# Patient Record
Sex: Female | Born: 1980 | Race: Black or African American | Hispanic: No | Marital: Single | Smoking: Current every day smoker
Health system: Southern US, Community
[De-identification: ages and names within clinical notes are randomized; demographics above are authoritative.]

## PROBLEM LIST (undated history)

## (undated) DIAGNOSIS — D571 Sickle-cell disease without crisis: Secondary | ICD-10-CM

## (undated) DIAGNOSIS — J45909 Unspecified asthma, uncomplicated: Secondary | ICD-10-CM

---

## 2017-09-21 ENCOUNTER — Other Ambulatory Visit: Payer: Self-pay

## 2017-09-21 ENCOUNTER — Emergency Department (HOSPITAL_COMMUNITY): Payer: Medicaid Other

## 2017-09-21 ENCOUNTER — Inpatient Hospital Stay (HOSPITAL_COMMUNITY)
Admission: EM | Admit: 2017-09-21 | Discharge: 2017-09-24 | DRG: 812 | Discharge status: Against Medical Advice | Payer: Medicaid Other | Attending: Internal Medicine | Admitting: Internal Medicine

## 2017-09-21 ENCOUNTER — Encounter (HOSPITAL_COMMUNITY): Payer: Self-pay | Admitting: Emergency Medicine

## 2017-09-21 DIAGNOSIS — Z86711 Personal history of pulmonary embolism: Secondary | ICD-10-CM

## 2017-09-21 DIAGNOSIS — Z7951 Long term (current) use of inhaled steroids: Secondary | ICD-10-CM | POA: Diagnosis not present

## 2017-09-21 DIAGNOSIS — R160 Hepatomegaly, not elsewhere classified: Secondary | ICD-10-CM | POA: Diagnosis present

## 2017-09-21 DIAGNOSIS — Z888 Allergy status to other drugs, medicaments and biological substances status: Secondary | ICD-10-CM | POA: Diagnosis not present

## 2017-09-21 DIAGNOSIS — D5700 Hb-SS disease with crisis, unspecified: Secondary | ICD-10-CM | POA: Diagnosis present

## 2017-09-21 DIAGNOSIS — Z79891 Long term (current) use of opiate analgesic: Secondary | ICD-10-CM | POA: Diagnosis not present

## 2017-09-21 DIAGNOSIS — Z79899 Other long term (current) drug therapy: Secondary | ICD-10-CM | POA: Diagnosis not present

## 2017-09-21 DIAGNOSIS — Z5321 Procedure and treatment not carried out due to patient leaving prior to being seen by health care provider: Secondary | ICD-10-CM | POA: Diagnosis not present

## 2017-09-21 DIAGNOSIS — J45909 Unspecified asthma, uncomplicated: Secondary | ICD-10-CM | POA: Diagnosis present

## 2017-09-21 DIAGNOSIS — R109 Unspecified abdominal pain: Secondary | ICD-10-CM

## 2017-09-21 DIAGNOSIS — F17210 Nicotine dependence, cigarettes, uncomplicated: Secondary | ICD-10-CM | POA: Diagnosis present

## 2017-09-21 DIAGNOSIS — Z720 Tobacco use: Secondary | ICD-10-CM

## 2017-09-21 HISTORY — DX: Sickle-cell disease without crisis: D57.1

## 2017-09-21 HISTORY — DX: Unspecified asthma, uncomplicated: J45.909

## 2017-09-21 LAB — URINALYSIS, ROUTINE W REFLEX MICROSCOPIC
Bilirubin Urine: NEGATIVE
GLUCOSE, UA: NEGATIVE mg/dL
HGB URINE DIPSTICK: NEGATIVE
Ketones, ur: NEGATIVE mg/dL
Leukocytes, UA: NEGATIVE
Nitrite: NEGATIVE
PROTEIN: NEGATIVE mg/dL
Specific Gravity, Urine: 1.011 (ref 1.005–1.030)
pH: 6 (ref 5.0–8.0)

## 2017-09-21 LAB — CBC WITH DIFFERENTIAL/PLATELET
ABS IMMATURE GRANULOCYTES: 0.1 10*3/uL (ref 0.0–0.1)
BASOS ABS: 0.1 10*3/uL (ref 0.0–0.1)
Basophils Relative: 1 %
Eosinophils Absolute: 0.3 10*3/uL (ref 0.0–0.7)
Eosinophils Relative: 2 %
HCT: 25.1 % — ABNORMAL LOW (ref 36.0–46.0)
HEMOGLOBIN: 8.7 g/dL — AB (ref 12.0–15.0)
IMMATURE GRANULOCYTES: 1 %
LYMPHS PCT: 19 %
Lymphs Abs: 2.8 10*3/uL (ref 0.7–4.0)
MCH: 33.5 pg (ref 26.0–34.0)
MCHC: 34.7 g/dL (ref 30.0–36.0)
MCV: 96.5 fL (ref 78.0–100.0)
Monocytes Absolute: 1.2 10*3/uL — ABNORMAL HIGH (ref 0.1–1.0)
Monocytes Relative: 8 %
Neutro Abs: 10.1 10*3/uL — ABNORMAL HIGH (ref 1.7–7.7)
Neutrophils Relative %: 69 %
Platelets: 471 10*3/uL — ABNORMAL HIGH (ref 150–400)
RBC: 2.6 MIL/uL — AB (ref 3.87–5.11)
RDW: 19.4 % — ABNORMAL HIGH (ref 11.5–15.5)
WBC: 14.6 10*3/uL — AB (ref 4.0–10.5)

## 2017-09-21 LAB — COMPREHENSIVE METABOLIC PANEL
ALBUMIN: 3.9 g/dL (ref 3.5–5.0)
ALT: 14 U/L (ref 14–54)
AST: 21 U/L (ref 15–41)
Alkaline Phosphatase: 137 U/L — ABNORMAL HIGH (ref 38–126)
Anion gap: 8 (ref 5–15)
BILIRUBIN TOTAL: 2.2 mg/dL — AB (ref 0.3–1.2)
BUN: 9 mg/dL (ref 6–20)
CO2: 22 mmol/L (ref 22–32)
Calcium: 9 mg/dL (ref 8.9–10.3)
Chloride: 106 mmol/L (ref 101–111)
Creatinine, Ser: 0.58 mg/dL (ref 0.44–1.00)
GFR calc Af Amer: >60 mL/min (ref >60)
GFR calc non Af Amer: >60 mL/min (ref >60)
Glucose, Bld: 102 mg/dL — ABNORMAL HIGH (ref 65–99)
POTASSIUM: 4 mmol/L (ref 3.5–5.1)
Sodium: 136 mmol/L (ref 135–145)
TOTAL PROTEIN: 6.5 g/dL (ref 6.5–8.1)

## 2017-09-21 LAB — RETICULOCYTES: RBC.: 2.6 MIL/uL — AB (ref 3.87–5.11)

## 2017-09-21 LAB — I-STAT BETA HCG BLOOD, ED (MC, WL, AP ONLY): I-stat hCG, quantitative: <5 m[IU]/mL (ref <5)

## 2017-09-21 MED ORDER — HYDROMORPHONE HCL 2 MG/ML IJ SOLN: 2.0000 mg | INTRAMUSCULAR | Status: DC

## 2017-09-21 MED ORDER — HYDROMORPHONE HCL 2 MG/ML IJ SOLN: 2.0000 mg | INTRAMUSCULAR | Status: AC

## 2017-09-21 MED ORDER — DIPHENHYDRAMINE HCL 25 MG PO CAPS
50.0000 mg | ORAL_CAPSULE | Freq: Once | ORAL | Status: AC
  Administered 2017-09-21: 50 mg via ORAL
  Filled 2017-09-21: qty 2

## 2017-09-21 MED ORDER — METHADONE HCL 10 MG PO TABS
5.0000 mg | ORAL_TABLET | Freq: Two times a day (BID) | ORAL | Status: DC
  Administered 2017-09-21 – 2017-09-24 (×6): 5 mg via ORAL
  Filled 2017-09-21 (×6): qty 1

## 2017-09-21 MED ORDER — ACETAMINOPHEN 650 MG RE SUPP: 650.0000 mg | Freq: Four times a day (QID) | RECTAL | Status: DC | PRN

## 2017-09-21 MED ORDER — DULOXETINE HCL 60 MG PO CPEP
60.0000 mg | ORAL_CAPSULE | Freq: Every day | ORAL | Status: DC
  Administered 2017-09-21 – 2017-09-24 (×4): 60 mg via ORAL
  Filled 2017-09-21 (×4): qty 1

## 2017-09-21 MED ORDER — SODIUM CHLORIDE 0.9 % IV BOLUS: 1000.0000 mL | Freq: Once | INTRAVENOUS | Status: DC

## 2017-09-21 MED ORDER — OXYCODONE HCL 5 MG PO TABS
10.0000 mg | ORAL_TABLET | Freq: Three times a day (TID) | ORAL | Status: DC | PRN
  Administered 2017-09-21 – 2017-09-24 (×5): 10 mg via ORAL
  Filled 2017-09-21 (×5): qty 2

## 2017-09-21 MED ORDER — SODIUM CHLORIDE 0.45 % IV SOLN
INTRAVENOUS | Status: DC
  Administered 2017-09-21: 22:00:00 via INTRAVENOUS
  Administered 2017-09-22: 150 mL via INTRAVENOUS
  Administered 2017-09-22: 04:00:00 via INTRAVENOUS
  Administered 2017-09-22: 150 mL via INTRAVENOUS
  Administered 2017-09-23 – 2017-09-24 (×4): via INTRAVENOUS

## 2017-09-21 MED ORDER — HYDROMORPHONE HCL 2 MG/ML IJ SOLN
2.0000 mg | INTRAMUSCULAR | Status: AC
  Administered 2017-09-21: 2 mg via INTRAVENOUS
  Filled 2017-09-21: qty 1

## 2017-09-21 MED ORDER — SENNOSIDES-DOCUSATE SODIUM 8.6-50 MG PO TABS
1.0000 | ORAL_TABLET | Freq: Two times a day (BID) | ORAL | Status: DC
  Administered 2017-09-22 – 2017-09-24 (×3): 1 via ORAL
  Filled 2017-09-21 (×4): qty 1

## 2017-09-21 MED ORDER — ENOXAPARIN SODIUM 40 MG/0.4ML ~~LOC~~ SOLN
40.0000 mg | Freq: Every day | SUBCUTANEOUS | Status: DC
  Administered 2017-09-22: 40 mg via SUBCUTANEOUS
  Filled 2017-09-21 (×2): qty 0.4

## 2017-09-21 MED ORDER — FOLIC ACID 1 MG PO TABS
1.0000 mg | ORAL_TABLET | Freq: Every day | ORAL | Status: DC
  Administered 2017-09-22 – 2017-09-24 (×3): 1 mg via ORAL
  Filled 2017-09-21 (×3): qty 1

## 2017-09-21 MED ORDER — PROMETHAZINE HCL 25 MG/ML IJ SOLN
25.0000 mg | Freq: Four times a day (QID) | INTRAMUSCULAR | Status: DC | PRN
  Administered 2017-09-22 – 2017-09-24 (×9): 25 mg via INTRAVENOUS
  Filled 2017-09-21 (×9): qty 1

## 2017-09-21 MED ORDER — SODIUM CHLORIDE 0.9% FLUSH
3.0000 mL | Freq: Two times a day (BID) | INTRAVENOUS | Status: DC
  Administered 2017-09-21 – 2017-09-24 (×2): 3 mL via INTRAVENOUS

## 2017-09-21 MED ORDER — HYDROMORPHONE HCL 2 MG/ML IJ SOLN
2.0000 mg | INTRAMUSCULAR | Status: DC | PRN
  Administered 2017-09-21 – 2017-09-22 (×3): 2 mg via INTRAVENOUS
  Filled 2017-09-21 (×3): qty 1

## 2017-09-21 MED ORDER — PROMETHAZINE HCL 25 MG PO TABS: 25.0000 mg | ORAL_TABLET | Freq: Four times a day (QID) | ORAL | Status: DC | PRN

## 2017-09-21 MED ORDER — KETOROLAC TROMETHAMINE 15 MG/ML IJ SOLN
15.0000 mg | Freq: Once | INTRAMUSCULAR | Status: DC
Start: 2017-09-21 — End: 2017-09-21
  Filled 2017-09-21: qty 1

## 2017-09-21 MED ORDER — OXYCODONE HCL 10 MG PO TABS: 10.0000 mg | ORAL_TABLET | Freq: Three times a day (TID) | ORAL | Status: DC | PRN

## 2017-09-21 MED ORDER — HYDROXYUREA 500 MG PO CAPS
1000.0000 mg | ORAL_CAPSULE | Freq: Every day | ORAL | Status: DC
  Filled 2017-09-21 (×4): qty 2

## 2017-09-21 MED ORDER — ALBUTEROL SULFATE (2.5 MG/3ML) 0.083% IN NEBU: 3.0000 mL | INHALATION_SOLUTION | Freq: Four times a day (QID) | RESPIRATORY_TRACT | Status: DC | PRN

## 2017-09-21 MED ORDER — GABAPENTIN 100 MG PO CAPS
100.0000 mg | ORAL_CAPSULE | Freq: Three times a day (TID) | ORAL | Status: DC
  Administered 2017-09-22 – 2017-09-24 (×9): 100 mg via ORAL
  Filled 2017-09-21 (×9): qty 1

## 2017-09-21 MED ORDER — DULOXETINE HCL 30 MG PO CPEP
30.0000 mg | ORAL_CAPSULE | Freq: Every day | ORAL | Status: DC
  Administered 2017-09-22 – 2017-09-24 (×3): 30 mg via ORAL
  Filled 2017-09-21 (×3): qty 1

## 2017-09-21 MED ORDER — HYDROMORPHONE HCL 2 MG/ML IJ SOLN
2.0000 mg | INTRAMUSCULAR | Status: DC
  Administered 2017-09-21: 2 mg via INTRAVENOUS
  Filled 2017-09-21 (×2): qty 1

## 2017-09-21 MED ORDER — MOMETASONE FURO-FORMOTEROL FUM 200-5 MCG/ACT IN AERO
2.0000 | INHALATION_SPRAY | Freq: Two times a day (BID) | RESPIRATORY_TRACT | Status: DC
  Administered 2017-09-22 – 2017-09-23 (×2): 2 via RESPIRATORY_TRACT
  Filled 2017-09-21 (×2): qty 8.8

## 2017-09-21 MED ORDER — DIPHENHYDRAMINE HCL 25 MG PO CAPS
50.0000 mg | ORAL_CAPSULE | Freq: Three times a day (TID) | ORAL | Status: DC | PRN
  Administered 2017-09-22 (×2): 50 mg via ORAL
  Filled 2017-09-21 (×2): qty 2

## 2017-09-21 MED ORDER — PROMETHAZINE HCL 25 MG/ML IJ SOLN
25.0000 mg | Freq: Once | INTRAMUSCULAR | Status: AC
  Administered 2017-09-21: 25 mg via INTRAMUSCULAR
  Filled 2017-09-21: qty 1

## 2017-09-21 MED ORDER — HYDROMORPHONE HCL 2 MG/ML IJ SOLN
2.0000 mg | INTRAMUSCULAR | Status: AC
  Administered 2017-09-21: 2 mg via INTRAMUSCULAR
  Filled 2017-09-21: qty 1

## 2017-09-21 MED ORDER — NICOTINE 14 MG/24HR TD PT24
14.0000 mg | MEDICATED_PATCH | Freq: Every day | TRANSDERMAL | Status: DC
  Filled 2017-09-21 (×2): qty 1

## 2017-09-21 MED ORDER — NALOXONE HCL 0.4 MG/ML IJ SOLN
0.4000 mg | INTRAMUSCULAR | Status: DC | PRN
Start: 2017-09-21 — End: 2017-09-24

## 2017-09-21 MED ORDER — ACETAMINOPHEN 325 MG PO TABS: 650.0000 mg | ORAL_TABLET | Freq: Four times a day (QID) | ORAL | Status: DC | PRN

## 2017-09-21 MED ORDER — POLYETHYLENE GLYCOL 3350 17 G PO PACK
17.0000 g | PACK | Freq: Every day | ORAL | Status: DC
  Administered 2017-09-22: 17 g via ORAL
  Filled 2017-09-21 (×2): qty 1

## 2017-09-21 MED ORDER — IOHEXOL 300 MG/ML  SOLN
100.0000 mL | Freq: Once | INTRAMUSCULAR | Status: AC | PRN
  Administered 2017-09-21: 100 mL via INTRAVENOUS

## 2017-09-21 NOTE — ED Notes (Signed)
Pt informed that IV team was consulted for second line placement

## 2017-09-21 NOTE — ED Triage Notes (Signed)
Pt. Stated, My legs, and back have been hurting since yesterday

## 2017-09-21 NOTE — ED Notes (Signed)
DC Ketorolac per attending resident

## 2017-09-21 NOTE — H&P (Signed)
Date: 09/21/2017               Patient Name:  Joanna [name] MRN: [id number]  DOB: 1980-05-05 Age / Sex: 37 y.o., female   PCP: Patient, No Pcp Per         Medical Service: Internal Medicine Teaching Service         Attending Physician: Dr. Mikey BussingHoffman, Marthenia RollingErik C, DO    First Contact: Dr. Evelene CroonSantos  Pager: [phone]  Second Contact: Dr. Noemi ChapelBethany Molt Pager: [phone]       After Hours (After 5p/  First Contact Pager: [phone]  weekends / holidays): Second Contact Pager: [phone]   Chief Complaint: Bilateral lower extremity pain, lower abdominal pain, nausea and vomiting   History of Present Illness:  Joanna [name] is a 37 yo female with history of hemoglobin SS disease, ?history of PE, and tobacco abuse who presents to the ED with a 2-day history of bilateral lower extremity pain, lower back pain, and abdominal pain. She was in her usual state of health until 2 days ago when she started to experience bilateral lower extremity pain across her entire legs that she describes as typical during her pain crises. She also complains of lower back pain and lower abdominal pain that is sharp in nature, intermittent, and associated with 2 episodes of NBNB emesis. She is on oxycodone and methadone at home which she has been compliant with until yesterday when the nausea vomiting began. Denies recent illness, changes in medications, or changes in diet. Denies HA, dizziness, fever, chills, chest pain, shortness of breath, and urinary symptoms.  She does report one episode of loose stools, but denies diarrhea.  She [facility] pain crisis and states she did require a blood transfusion at that time.  ED course: Patient was afebrile, hemodynamically stable and oxygenating well on room air. Blood work remarkable for WBC 14.6, Hgb 8.7 (at baseline), and reticulocyte count 2.6. CT abd/pelvis with 2 low-attenuating masses in R hepatic lobe, but otherwise unremarkable. Received Dilaudid  2 mg x4.   Meds:  Current Meds  Medication Sig  . albuterol (PROVENTIL HFA) 108 (90 Base) MCG/ACT inhaler Inhale 2 puffs into the lungs every 6 (six) hours as needed.  . DULoxetine (CYMBALTA) 30 MG capsule Take 90 mg by mouth See admin instructions. Take 2 capsules (60mg ) in th AM and 1 capsule (30mg ) in the evening.  . fluticasone (FLONASE) 50 MCG/ACT nasal spray Place 1 spray into the nose as needed.  . Fluticasone-Salmeterol (ADVAIR DISKUS) 250-50 MCG/DOSE AEPB Inhale 1 puff into the lungs 2 (two) times daily.  . folic acid (FOLVITE) 1 MG tablet Take 1 mg by mouth daily.  . furosemide (LASIX) 20 MG tablet Take 20 mg by mouth daily as needed. For leg swelling  . gabapentin (NEURONTIN) 100 MG capsule Take 100 mg by mouth 3 (three) times daily.  . hydroxyurea (HYDREA) 100 mg/mL SUSP Take 1,000 mg by mouth daily.  . methadone (DOLOPHINE) 5 MG tablet Take 5 mg by mouth every 12 (twelve) hours.  . naloxone (NARCAN) nasal spray 4 mg/0.1 mL Place 1 spray into the nose once.   . Oxycodone HCl 10 MG TABS Take 10 mg by mouth 3 (three) times daily as needed for pain.    Allergies: Allergies as of 09/21/2017 - Review Complete 09/21/2017  Allergen Reaction Noted  . Ondansetron Hives, Itching, and Rash 09/28/2005  . Metoclopramide Hives and Rash 09/16/2010  Past Medical History:  Diagnosis Date  . Asthma   . Sickle cell anemia (HCC)     Family History:  Family History  Problem Relation Age of Onset  . Diabetes Father     Social History: Patient reports alcohol intake socially with last drink about 2 months ago. Smokes 1 ppd. Denies illicit drug use.   Review of Systems: A complete ROS was negative except as per HPI.   Physical Exam: Blood pressure 105/68, pulse 66, temperature 98.6 F (37 C), temperature source Oral, resp. rate 17, height 4\' 11"  (1.499 m), weight 150 lb (68 kg), last menstrual period 09/07/2017, SpO2 95 %.  Physical Exam  Constitutional: She is oriented to person,  place, and time and well-developed, well-nourished, and in no distress.  HENT:  Head: Normocephalic and atraumatic.  Mouth/Throat: Oropharynx is clear and moist.  Eyes: Conjunctivae are normal.  Neck: Normal range of motion. Neck supple.  Cardiovascular: Normal rate, regular rhythm and normal heart sounds. Exam reveals no gallop and no friction rub.  No murmur heard. Pulmonary/Chest: Effort normal and breath sounds normal. No respiratory distress. She has no wheezes. She has no rales.  Abdominal: Soft. Bowel sounds are normal. She exhibits no distension. There is no tenderness. There is no guarding.  Musculoskeletal: She exhibits edema (Trace bilatera LE edema ) and tenderness (Tenderness to palpation over bilateral extremities and lower back ). She exhibits no deformity.  Neurological: She is alert and oriented to person, place, and time.  Skin: She is not diaphoretic.    EKG: none obtained   CT abdomen/pelvis w/ contrast: personally reviewed my interpretation is: No acute findings; 2 non-specific low-attenuation masses in right hepatic lobe felt to represent focal nodular hyperplasia or adenomas. Non-emergent abdomen MRI recommended for further characterization.   Assessment & Plan by Problem: Principal Problem:   Sickle cell pain crisis (HCC) Active Problems:   Abdominal pain   Tobacco abuse  # Sickle cell pain crisis: Patient presents with bilateral LE pain, lower back pain, lower abdominal pain and N/V that are typical during her pain crisis and not improving with home opiates. Unclear trigger, patient denies recent illness or stressors. Hemoglobin at baseline.  Follows up with hematology at [facility] and was admitted 3-4 months ago with similar symptoms requiring 2 blood transfusions.  - IV Dilaudid 2 mg q2h PRN. Plan to switch to PCA if poor pain control on current regimen  - Continue home oxy 10 TID PRN and methadone 5 mg BID PRN  - Continue home Cymbalta 90 mg QD and gabapentin 100 mg  TID  - LDH and haptoglobin   # Abdominal pain associated with nausea and vomiting: Sharp and intermittent in nature and located in bilateral lower quadrants. No urinary symptoms or vaginal discharge. CT abd/pelvis with 2 R hepatic lobe masses but otherwise unremarkable.  Abdominal exam benign without tenderness to palpation. Etiology uncertain. Could be associated with menstrual cycle, but patient unable to recall LMP. UA clean therefore low suspicion for  UTI.  We will continue to monitor for now given no symptoms.  Anti-emetics as above. Ordered regular diet, but can de-escalate if patient continues to have N/V.   # Tobacco abuse: Reports smoking 1 ppd for "years." - Nicotine patch 14 mg QD   F: 1L NS bolus followed by maintenance with 1/2NS E: monitoring, avoid hypernatremia  N: Regular diet   VTE ppx: SQ Lovenox   Code status: Full code, not confirmed on admission   Dispo: Admit patient  to Inpatient with expected length of stay greater than 2 midnights.  SignedBurna Cash, MD 09/21/2017, 7:20 PM  Pager: [phone]

## 2017-09-21 NOTE — ED Provider Notes (Signed)
[facility] Provider Note   CSN: [id number] Arrival date & time: 09/21/17  0754     History   Chief Complaint Chief Complaint  Patient presents with  . Sickle Cell Pain Crisis  . Leg Pain  . Back Pain    HPI Hillary Bowngel Golaszewski is a 37 y.o. female.  HPI  37 year old female with a history of sickle cell anemia, SS, presents with back pain, bilateral leg pain, and lower abdominal pain.  She states the back pain is relatively new feature in her lower back for the last couple months.  However its worse over the last 2 days.  It is across her entire lower back and seems to be worse when walking.  No weakness or numbness in her legs but her legs do swell whenever they hurt.  The leg pain is typical of her sickle cell crises, as well as some leg swelling.  The sharp, lower abdominal pain that comes and goes is new.  She vomited yesterday, including some of her pain medicines.  No diarrhea, constipation, fever, urinary symptoms or vaginal symptoms.  She is on methadone and oxycodone.  Past Medical History:  Diagnosis Date  . Asthma   . Sickle cell anemia [facility](HCC)     Patient Active Problem List   Diagnosis Date Noted  . Sickle cell pain crisis (HCC) 09/21/2017    History reviewed. No pertinent surgical history.   OB History   None      Home Medications    Prior to Admission medications   Medication Sig Start Date End Date Taking? Authorizing Provider  albuterol (PROVENTIL HFA) 108 (90 Base) MCG/ACT inhaler Inhale 2 puffs into the lungs every 6 (six) hours as needed. 12/20/13  Yes [provider]  DULoxetine (CYMBALTA) 30 MG capsule Take 90 mg by mouth See admin instructions. Take 2 capsules (60mg ) in th AM and 1 capsule (30mg ) in the evening. 09/04/17  Yes [provider]  fluticasone (FLONASE) 50 MCG/ACT nasal spray Place 1 spray into the nose as needed.   Yes [provider]  Fluticasone-Salmeterol (ADVAIR DISKUS) 250-50  MCG/DOSE AEPB Inhale 1 puff into the lungs 2 (two) times daily. 08/26/17  Yes [provider]  folic acid (FOLVITE) 1 MG tablet Take 1 mg by mouth daily.   Yes [provider]  furosemide (LASIX) 20 MG tablet Take 20 mg by mouth daily as needed. For leg swelling 08/26/17  Yes [provider]  gabapentin (NEURONTIN) 100 MG capsule Take 100 mg by mouth 3 (three) times daily. 08/17/17  Yes [provider]  hydroxyurea (HYDREA) 100 mg/mL SUSP Take 1,000 mg by mouth daily.   Yes [provider]  methadone (DOLOPHINE) 5 MG tablet Take 5 mg by mouth every 12 (twelve) hours.   Yes [provider]  naloxone (NARCAN) nasal spray 4 mg/0.1 mL Place 1 spray into the nose once.  11/21/14  Yes [provider]  Oxycodone HCl 10 MG TABS Take 10 mg by mouth 3 (three) times daily as needed for pain.   Yes [provider]    Family History Family History  Problem Relation Age of Onset  . Diabetes Father     Social History Social History   Tobacco Use  . Smoking status: Current Every Day Smoker    Packs/day: 1.00  . Smokeless tobacco: Current User  Substance Use Topics  . Alcohol use: Not Currently    Comment: "every blue moon"  . Drug  use: Not Currently     Allergies   Ondansetron and Metoclopramide   Review of Systems Review of Systems  Constitutional: Negative for fever.  Respiratory: Negative for shortness of breath.   Cardiovascular: Negative for chest pain.  Gastrointestinal: Positive for abdominal pain, nausea and vomiting. Negative for constipation and diarrhea.  Genitourinary: Negative for dysuria, hematuria, vaginal bleeding and vaginal discharge.  Musculoskeletal: Positive for back pain and myalgias.  Neurological: Negative for weakness and numbness.  All other systems reviewed and are negative.    Physical Exam Updated Vital Signs BP 105/68   Pulse 66   Temp 98.6 F (37 C) (Oral)   Resp 17   Ht 4\' 11"   (1.499 m)   Wt 68 kg (150 lb)   LMP 09/07/2017   SpO2 95%   BMI 30.30 kg/m   Physical Exam  Constitutional: She is oriented to person, place, and time. She appears well-developed and well-nourished. No distress.  Appears in pain  HENT:  Head: Normocephalic and atraumatic.  Right Ear: External ear normal.  Left Ear: External ear normal.  Nose: Nose normal.  Eyes: Right eye exhibits no discharge. Left eye exhibits no discharge.  Cardiovascular: Normal rate, regular rhythm and normal heart sounds.  Pulmonary/Chest: Effort normal and breath sounds normal.  Abdominal: Soft. She exhibits no distension. There is no tenderness.  Musculoskeletal:       Thoracic back: She exhibits no tenderness and no bony tenderness.       Lumbar back: She exhibits no tenderness and no bony tenderness.       Right lower leg: She exhibits swelling. She exhibits no tenderness, no bony tenderness and no edema.       Left lower leg: She exhibits swelling. She exhibits no tenderness, no bony tenderness and no edema.  No pitting edema to BLE. Normal gross sensation. Normal strength in BLE  Neurological: She is alert and oriented to person, place, and time.  Skin: Skin is warm and dry. She is not diaphoretic.  Nursing note and vitals reviewed.    ED Treatments / Results  Labs (all labs ordered are listed, but only abnormal results are displayed) Labs Reviewed  COMPREHENSIVE METABOLIC PANEL - Abnormal; Notable for the following components:      Result Value   Glucose, Bld 102 (*)    Alkaline Phosphatase 137 (*)    Total Bilirubin 2.2 (*)    All other components within normal limits  CBC WITH DIFFERENTIAL/PLATELET - Abnormal; Notable for the following components:   WBC 14.6 (*)    RBC 2.60 (*)    Hemoglobin 8.7 (*)    HCT 25.1 (*)    RDW 19.4 (*)    Platelets 471 (*)    Neutro Abs 10.1 (*)    Monocytes Absolute 1.2 (*)    All other components within normal limits  RETICULOCYTES - Abnormal; Notable  for the following components:   Retic Ct Pct >23.0 (*)    RBC. 2.60 (*)    All other components within normal limits  URINALYSIS, ROUTINE W REFLEX MICROSCOPIC  HIV ANTIBODY (ROUTINE TESTING)  CBC  BASIC METABOLIC PANEL  I-STAT BETA HCG BLOOD, ED ([facility], [facility], [facility] ONLY)    EKG None  Radiology Ct Abdomen Pelvis W Contrast  Result Date: 09/21/2017 CLINICAL DATA:  Lower abdominal and back pain for 2 days. Sickle cell disease. EXAM: CT ABDOMEN AND PELVIS WITH CONTRAST TECHNIQUE: Multidetector CT imaging of the abdomen and pelvis was performed using the standard protocol  following bolus administration of intravenous contrast. CONTRAST:  OMNIPAQUE IOHEXOL 300 MG/ML  SOLN COMPARISON:  None. FINDINGS: Lower Chest: No acute findings.  Bibasilar scarring. Hepatobiliary: 2.1 cm low-attenuation lesion is seen in the dome of the right hepatic lobe. A 2nd low-subtle attenuation lesion is seen in the inferior right hepatic lobe measuring 2.8 cm. These both have nonspecific characteristics. Prior cholecystectomy. No evidence of biliary obstruction. Pancreas:  No mass or inflammatory changes. 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 Adrenals/Urinary Tract: No masses identified. No evidence of hydronephrosis. Unremarkable unopacified urinary bladder. Stomach/Bowel: No evidence of obstruction, inflammatory process or abnormal fluid collections. Normal appendix visualized. Vascular/Lymphatic: No pathologically enlarged lymph nodes. No abdominal aortic aneurysm. Reproductive:  No mass or other significant abnormality. Other:  None. Musculoskeletal:  No suspicious bone lesions identified. IMPRESSION: No acute findings. At least 2 low-attenuation masses in right hepatic lobe which have nonspecific characteristics. These may represent focal nodular hyperplasia or adenomas given patient's age and sex. Abdomen MRI without and with contrast is recommended for further  characterization. Electronically Signed   By: Myles Rosenthal M.D.   On: 09/21/2017 12:12    Procedures Procedures (including critical care time)  Medications Ordered in ED Medications  sodium chloride 0.9 % bolus 1,000 mL ( Intravenous Continued from Pre-op 09/21/17 1408)  0.45 % sodium chloride infusion (has no administration in time range)  diphenhydrAMINE (BENADRYL) capsule 50 mg (has no administration in time range)  HYDROmorphone (DILAUDID) injection 2 mg (2 mg Intravenous Not Given 09/21/17 1408)  promethazine (PHENERGAN) injection 25 mg (has no administration in time range)  albuterol (PROVENTIL) (2.5 MG/3ML) 0.083% nebulizer solution 3 mL (has no administration in time range)  DULoxetine (CYMBALTA) DR capsule 90 mg (has no administration in time range)  mometasone-formoterol (DULERA) 200-5 MCG/ACT inhaler 2 puff (has no administration in time range)  folic acid (FOLVITE) tablet 1 mg (has no administration in time range)  gabapentin (NEURONTIN) capsule 100 mg (has no administration in time range)  hydroxyurea (HYDREA) 100 mg/mL oral suspension 1,000 mg (has no administration in time range)  methadone (DOLOPHINE) tablet 5 mg (has no administration in time range)  enoxaparin (LOVENOX) injection 40 mg (has no administration in time range)  sodium chloride flush (NS) 0.9 % injection 3 mL (has no administration in time range)  naloxone (NARCAN) injection 0.4 mg (has no administration in time range)  acetaminophen (TYLENOL) tablet 650 mg (has no administration in time range)    Or  acetaminophen (TYLENOL) suppository 650 mg (has no administration in time range)  oxyCODONE (Oxy IR/ROXICODONE) immediate release tablet 10 mg (has no administration in time range)  senna-docusate (Senokot-S) tablet 1 tablet (has no administration in time range)  polyethylene glycol (MIRALAX / GLYCOLAX) packet 17 g (has no administration in time range)  nicotine (NICODERM CQ - dosed in mg/24 hours) patch 14 mg  (has no administration in time range)  HYDROmorphone (DILAUDID) injection 2 mg (2 mg Intramuscular Given 09/21/17 0915)    Or  HYDROmorphone (DILAUDID) injection 2 mg ( Subcutaneous See Alternative 09/21/17 0915)  HYDROmorphone (DILAUDID) injection 2 mg (2 mg Intravenous Given 09/21/17 1032)    Or  HYDROmorphone (DILAUDID) injection 2 mg ( Subcutaneous See Alternative 09/21/17 1032)  promethazine (PHENERGAN) injection 25 mg (25 mg Intramuscular Given 09/21/17 0914)  iohexol (OMNIPAQUE) 300 MG/ML solution 100 mL (100 mLs Intravenous Contrast Given 09/21/17 1116)  diphenhydrAMINE (BENADRYL) capsule 50 mg (50 mg Oral Given 09/21/17 1154)  Initial Impression / Assessment and Plan / ED Course  I have reviewed the triage vital signs and the nursing notes.  Pertinent labs & imaging results that were available during my care of the patient were reviewed by me and considered in my medical decision making (see chart for details).     The patient's sickle cell pain crisis is not well controlled with IV Dilaudid.  I will add on Toradol given negative CT.  Hemoglobin seems to be a lot similar to her typical.  While the abdominal pain and back pain are new for her, there does not seem to be a new emergent process.  Given poor pain control, she will be admitted to the internalizing teaching service.  Final Clinical Impressions(s) / ED Diagnoses   Final diagnoses:  Sickle cell pain crisis [facility])    ED Discharge Orders    None       Pricilla Loveless, MD 09/21/17 1718

## 2017-09-21 NOTE — ED Notes (Signed)
Gave pt [place] sandwich, applesauce and ice water, per Morrie SheldonAshley - RN.

## 2017-09-21 NOTE — ED Notes (Signed)
IV established per IVT.  Pt informed that RN to bring pain medications.  Pt requests Benadryl and phenergan and dilaudid at the same time.  This RN informed pt that she will receive the hydromorphone then will be re-evaluated for the phenergan and benadryl by the oncoming RN

## 2017-09-21 NOTE — ED Notes (Addendum)
Regular diet dinner tray ordered 

## 2017-09-22 ENCOUNTER — Encounter (HOSPITAL_COMMUNITY): Payer: Self-pay | Admitting: *Deleted

## 2017-09-22 ENCOUNTER — Other Ambulatory Visit: Payer: Self-pay

## 2017-09-22 LAB — BASIC METABOLIC PANEL
ANION GAP: 7 (ref 5–15)
BUN: 9 mg/dL (ref 6–20)
CALCIUM: 8.8 mg/dL — AB (ref 8.9–10.3)
CO2: 24 mmol/L (ref 22–32)
CREATININE: 0.58 mg/dL (ref 0.44–1.00)
Chloride: 105 mmol/L (ref 101–111)
GFR calc non Af Amer: >60 mL/min (ref >60)
Glucose, Bld: 96 mg/dL (ref 65–99)
Potassium: 4.5 mmol/L (ref 3.5–5.1)
Sodium: 136 mmol/L (ref 135–145)

## 2017-09-22 LAB — CBC
HEMATOCRIT: 21.9 % — AB (ref 36.0–46.0)
HEMOGLOBIN: 7.5 g/dL — AB (ref 12.0–15.0)
MCH: 32.5 pg (ref 26.0–34.0)
MCHC: 34.2 g/dL (ref 30.0–36.0)
MCV: 94.8 fL (ref 78.0–100.0)
Platelets: 464 10*3/uL — ABNORMAL HIGH (ref 150–400)
RBC: 2.31 MIL/uL — ABNORMAL LOW (ref 3.87–5.11)
RDW: 19.9 % — AB (ref 11.5–15.5)
WBC: 11.9 10*3/uL — AB (ref 4.0–10.5)

## 2017-09-22 LAB — LACTATE DEHYDROGENASE: LDH: 228 U/L — AB (ref 98–192)

## 2017-09-22 MED ORDER — HYDROMORPHONE HCL 1 MG/ML IJ SOLN
2.0000 mg | INTRAMUSCULAR | Status: DC | PRN
  Administered 2017-09-22 (×2): 2 mg via INTRAVENOUS
  Filled 2017-09-22 (×2): qty 2

## 2017-09-22 MED ORDER — HYDROMORPHONE HCL 1 MG/ML IJ SOLN
2.0000 mg | INTRAMUSCULAR | Status: DC | PRN
  Administered 2017-09-22 – 2017-09-24 (×23): 2 mg via INTRAVENOUS
  Filled 2017-09-22 (×23): qty 2

## 2017-09-22 NOTE — Progress Notes (Signed)
Pt request dilaudid 2mg  IV,phenergan 25mg  IV and benadryl 50mg  all at one time.Pt complained that the phenergan 25mg  IV did not burn her vein when I gave it IV.Explained to pt that IV phenergan is diluted and giving by slow IV push to prevent severe tissue damage,Pt yelled and stated" that its suppose to burn my vein BITCH !!" I explained to pt that if phenergan is giving fast through the IV it will burn her veins,pt refused teaching she will need reinforcement on how phenergan IV is giving will continue to monitor

## 2017-09-22 NOTE — Evaluation (Signed)
Physical Therapy Evaluation Patient Details Name: Joanna [name] MRN: [id number] DOB: [birth year] Today's Date: 09/22/2017   History of Present Illness  Joanna [name] is a 37 yo female with history of hemoglobin SS disease, ?history of PE, and tobacco abuse who presents to the ED with a 2-day history of bilateral lower extremity pain, lower back pain, and abdominal pain.  Clinical Impression  Patient presents at modified independent to independent level for mobility.  Does exhibit some LE weakness and has lower back pain that pre-dates this crisis.  Feel she may benefit from outpatient PT in her area.  Educated her to research if [employer] has a Programmer, applications so she could participate even if her Medicaid does not cover it.  No further skilled acute PT needs at this time.     Follow Up Recommendations Outpatient PT    Equipment Recommendations  None recommended by PT    Recommendations for Other Services       Precautions / Restrictions Precautions Precautions: None      Mobility  Bed Mobility Overal bed mobility: Modified Independent                Transfers Overall transfer level: Modified independent                  Ambulation/Gait Ambulation/Gait assistance: Independent Ambulation Distance (Feet): 150 Feet Assistive device: None Gait Pattern/deviations: Step-through pattern;Decreased stride length     General Gait Details: good pace   Stairs Stairs: Yes Stairs assistance: Modified independent (Device/Increase time) Stair Management: One rail Right Number of Stairs: 3 General stair comments: good pace and no assist, but c/o pain in legs with stairs  Wheelchair Mobility    Modified Rankin (Stroke Patients Only)       Balance Overall balance assessment: Needs assistance;No apparent balance deficits (not formally assessed)                                           Pertinent Vitals/Pain Pain Assessment: 0-10 Pain  Score: 6  Pain Location: legs Pain Descriptors / Indicators: Aching Pain Intervention(s): Monitored during session;Repositioned    Home Living Family/patient expects to be discharged to:: Private residence Living Arrangements: Parent Available Help at Discharge: Family;Available PRN/intermittently Type of Home: House Home Access: Stairs to enter Entrance Stairs-Rails: None Entrance Stairs-Number of Steps: 3 Home Layout: One level Home Equipment: None      Prior Function Level of Independence: Independent         Comments: has some difficulty getting out of tub at times; also has back pain over past 2-3 months that limits walking to about 15 minutes     Hand Dominance        Extremity/Trunk Assessment   Upper Extremity Assessment Upper Extremity Assessment: Overall WFL for tasks assessed    Lower Extremity Assessment Lower Extremity Assessment: RLE deficits/detail;LLE deficits/detail RLE Deficits / Details: AROM WFL, strength grossly 3+ to 4-  LLE Deficits / Details: AROM WFL, strength grossly 3+ to 4-        Communication   Communication: No difficulties  Cognition Arousal/Alertness: Awake/alert Behavior During Therapy: WFL for tasks assessed/performed Overall Cognitive Status: Within Functional Limits for tasks assessed  General Comments General comments (skin integrity, edema, etc.): Patient reports back pain that is across her lower back below waist line from one side to the other; not tender to palpation and not exacerbated by flexion/extension, but aggravated from walking >15 minutes.  Reports has not done therapy for her back, concerned about payment for it due to Medicaid.  Educated about possibility of financial assistance at facility near [place] near where she lives.     Exercises     Assessment/Plan    PT Assessment All further PT needs can be met in the next venue of care  PT Problem List          PT Treatment Interventions      PT Goals (Current goals can be found in the Care Plan section)  Acute Rehab PT Goals PT Goal Formulation: All assessment and education complete, DC therapy    Frequency     Barriers to discharge        Co-evaluation               AM-PAC PT "6 Clicks" Daily Activity  Outcome Measure Difficulty turning over in bed (including adjusting bedclothes, sheets and blankets)?: None Difficulty moving from lying on back to sitting on the side of the bed? : None Difficulty sitting down on and standing up from a chair with arms (e.g., wheelchair, bedside commode, etc,.)?: None Help needed moving to and from a bed to chair (including a wheelchair)?: None Help needed walking in hospital room?: None Help needed climbing 3-5 steps with a railing? : A Little 6 Click Score: 23    End of Session   Activity Tolerance: Patient tolerated treatment well Patient left: in bed;with call bell/phone within reach   PT Visit Diagnosis: Pain;Muscle weakness (generalized) (M62.81) Pain - Right/Left: (both sides) Pain - part of body: (lower back)    Time: 8768-1157 PT Time Calculation (min) (ACUTE ONLY): 20 min   Charges:   PT Evaluation $PT Eval Low Complexity: 1 Low     PT G CodesMagda Kiel, [place] [phone] 09/22/2017   Reginia Naas 09/22/2017, 2:22 PM

## 2017-09-22 NOTE — Progress Notes (Signed)
   Subjective:  No acute events overnight.  She continues to report bilateral lower extremity pain that is improved from yesterday.  Lower back pain also improved.  Continues to report poor appetite but states will try to eat today. No further nausea or vomiting.  Denies diarrhea.  Objective:  Vital signs in last 24 hours: Vitals:   09/22/17 0132 09/22/17 0140 09/22/17 0523 09/22/17 0944  BP:  (!) 95/56 97/63 (!) 96/57  Pulse:  71 84 76  Resp:  16 16 18   Temp:  98.1 F (36.7 C) 98.2 F (36.8 C) 98.4 F (36.9 C)  TempSrc:  Oral Oral Oral  SpO2:  92% 90% 96%  Weight: 154 lb 12.2 oz (70.2 kg)     Height:       Physical Exam  Constitutional: She is well-developed, well-nourished, and in no distress.  Cardiovascular: Normal rate, regular rhythm and normal heart sounds. Exam reveals no gallop and no friction rub.  No murmur heard. Pulmonary/Chest: Effort normal and breath sounds normal. No respiratory distress. She has no wheezes. She has no rales.  Abdominal: Soft. Bowel sounds are normal. She exhibits no distension. There is no tenderness.  Musculoskeletal: She exhibits edema (Trace bilateral lower extremity edema, mildly improved from yesterday) and tenderness (Tenderness over bilateral lower extremities).  Neurological: She is alert.    Assessment/Plan:  Principal Problem:   Sickle cell pain crisis (HCC) Active Problems:   Abdominal pain   Tobacco abuse  # Sickle cell pain crisis: Continues to report bilateral lower extremity pain that is improved from yesterday.  Blood work unremarkable this morning. Hemoglobin remains at baseline.  LDH 288 and haptoglobin process. Will continue pain management as below as patient reports improvement in pain.  - IV Dilaudid 2 mg q2h PRN. Plan to switch to PCA if poor pain control on current regimen  - Continue home oxy 10 TID PRN and methadone 5 mg BID PRN  - Continue home Cymbalta 90 mg QD and gabapentin 100 mg TID   # Abdominal pain  associated with nausea and vomiting: Abdominal pain is resolved as well as nausea vomiting, though she continues to report poor appetite.  She is likely secondary to to acute sickle cell crisis and would expect this to improve with conservative management as below. - mIVF with 0.5NS @ 125 cc/hr  - IV Phenergan 25 mg q6h PRN  - Regular diet     Dispo: Anticipated discharge in approximately 2-3 day(s) pending improvement in pain.   Burna CashSantos-Sanchez, Jahshua Bonito, MD 09/22/2017, 11:49 AM Pager: [phone]

## 2017-09-23 ENCOUNTER — Inpatient Hospital Stay (HOSPITAL_COMMUNITY): Payer: Medicaid Other

## 2017-09-23 LAB — CBC
HEMATOCRIT: 18.7 % — AB (ref 36.0–46.0)
HEMOGLOBIN: 6.7 g/dL — AB (ref 12.0–15.0)
MCH: 33.3 pg (ref 26.0–34.0)
MCHC: 35.8 g/dL (ref 30.0–36.0)
MCV: 93 fL (ref 78.0–100.0)
Platelets: 470 10*3/uL — ABNORMAL HIGH (ref 150–400)
RBC: 2.01 MIL/uL — ABNORMAL LOW (ref 3.87–5.11)
RDW: 20.6 % — AB (ref 11.5–15.5)
WBC: 14.8 10*3/uL — AB (ref 4.0–10.5)

## 2017-09-23 LAB — HAPTOGLOBIN

## 2017-09-23 LAB — HIV ANTIBODY (ROUTINE TESTING W REFLEX): HIV Screen 4th Generation wRfx: NONREACTIVE

## 2017-09-23 NOTE — Progress Notes (Addendum)
Patient refused miralax and senokot; discussed risk for constipation with opiate use.  Patient refused lovenox, discussed risk of blood clot.   Patient stated MDs are allowing her to take PRN Dilaudid and Oxycodone at the same time. Informed patient we will alternate medication if pain is persistent but they will not be administered together.

## 2017-09-23 NOTE — Progress Notes (Signed)
Patients room smelled like cigarette smoke, patient denies smoking but has been made aware that it is against hospital policy

## 2017-09-23 NOTE — Progress Notes (Signed)
   Subjective:  No acute events overnight. This morning patient was resting in bed comfortably.  She did complain of bilateral hip pain that has been improving with IV pain medicine. She is otherwise feeling well. States she believes she might feel back to baseline tomorrow.   Objective:  Vital signs in last 24 hours: Vitals:   09/22/17 0944 09/22/17 1738 09/22/17 2113 09/23/17 0536  BP: (!) 96/57 101/65 107/67 96/63  Pulse: 76 78 85 75  Resp: 18 18 16 18   Temp: 98.4 F (36.9 C) 98.6 F (37 C) 99.3 F (37.4 C) 98.9 F (37.2 C)  TempSrc: Oral Oral Oral Oral  SpO2: 96% 98% 93% (!) 89%  Weight:   155 lb (70.3 kg)   Height:       Physical Exam  Constitutional: She is oriented to person, place, and time and well-developed, well-nourished, and in no distress.  Cardiovascular: Normal rate, regular rhythm and normal heart sounds. Exam reveals no gallop and no friction rub.  No murmur heard. Pulmonary/Chest: Effort normal and breath sounds normal. No respiratory distress. She has no wheezes. She has no rales.  Abdominal: Soft. Bowel sounds are normal. She exhibits no distension. There is no tenderness. There is no rebound.  Musculoskeletal: She exhibits tenderness (bilateral LE TTP but improved from yesterday). She exhibits no edema.  Neurological: She is alert and oriented to person, place, and time.    Assessment/Plan:  Principal Problem:   Sickle cell pain crisis (HCC) Active Problems:   Abdominal pain   Tobacco abuse  # Sickle cell pain crisis: Complaining of bilateral hip pain that improves with IV pain medicine. Hemoglobin 6.7 today but asymptomatic and vital signs remain stable.  LDH 288 and haptoglobin < 10 (unclear baseline).  No indication for blood transfusion at this time.  We will continue to monitor blood counts. - CBC in AM  - IV Dilaudid 2 mg q2h PRN  - Continue home oxy 10 TID PRN and methadone 5 mg BID PRN  # Abdominal pain associated with nausea and vomiting:  Pain resolved and appetite improving.  - mIVF with 0.5NS @ 100 cc/hr  - IV Phenergan 25 mg q6h PRN  - Regular diet   - Will need follow up MRI to evaluate hepatic masses seen on admission CT abd/pelvis given second mass (not on prior MRI). Can consider doing during this admission vs outpatient.    Dispo: Anticipated discharge in approximately 1-2 day(s).   Joanna CashSantos-Sanchez, Joanna Stinger, MD 09/23/2017, 7:49 AM Pager: [phone]

## 2017-09-24 MED ORDER — SODIUM CHLORIDE 0.45 % IV BOLUS
500.0000 mL | Freq: Once | INTRAVENOUS | Status: AC
  Administered 2017-09-24: 500 mL via INTRAVENOUS

## 2017-09-24 NOTE — Progress Notes (Signed)
Patient is again refusing MRI due to the fact she would need to be NPO for four hours

## 2017-09-24 NOTE — Progress Notes (Signed)
Patient refused CBC, MD aware, patient is aware of risks

## 2017-09-24 NOTE — Progress Notes (Signed)
Patient left AMA. IV removed.

## 2017-09-24 NOTE — Progress Notes (Signed)
Made MD aware that patient refused MRI.  Also, patient c/o right arm feeling like it is "asleep".  No other neuro deficits noted.  No additional orders received.  Will continue to monitor.  Joanna CoveyKimberly Susumu Hackler RN-BC, [employer]

## 2017-09-24 NOTE — Progress Notes (Addendum)
   Subjective:  No acute events overnight. Patient is feeling much better this morning and reports no complaints.   Objective:  Vital signs in last 24 hours: Vitals:   09/23/17 0917 09/23/17 1701 09/23/17 2040 09/24/17 0550  BP: 100/69 105/61 97/66 103/67  Pulse: 81 78 87 82  Resp: 18 18 20 20   Temp: 98.8 F (37.1 C) 98.6 F (37 C) 98.2 F (36.8 C) 97.8 F (36.6 C)  TempSrc: Oral Oral Oral Oral  SpO2: 94% 96% 92% 96%  Weight:      Height:       Physical Exam  Constitutional: She is well-developed, well-nourished, and in no distress.  Cardiovascular: Normal rate, regular rhythm and normal heart sounds. Exam reveals no gallop.  No murmur heard. Pulmonary/Chest: Effort normal and breath sounds normal. No respiratory distress. She has no wheezes. She has no rales.  Musculoskeletal: She exhibits no edema or tenderness.  Neurological: She is alert.    Assessment/Plan:  Principal Problem:   Sickle cell pain crisis (HCC) Active Problems:   Abdominal pain   Tobacco abuse  # Sickle cell pain crisis:States she feels much better this morning and believes she will back to baseline tomorrow. Will continue IV fluids and analgesics as below.  - Follow up CBC  - IV Dilaudid 2 mg q2h PRN  - Continue home oxy 10 TID PRN and methadone 5 mg BID PRN  # Abdominal pain associated with nausea and vomiting:Resolved. Tolerating PO intake.  - mIVF with 0.5NS @ 100 cc/hr  - IV Phenergan 25 mg q6h PRN  - Regular diet  # Liver masses:  -  Noted to have a 2.8x3.4 cm R hepatic lobe mass with nonspecific characteristics on MRI abdomen in 10/2016. Per report, does not have the appearance of a simple cyst or hemangioma. CT abdomen/pelvis on admission with 2 noted R hepatic lobe masses (1 know and 1 new) also described as nonspecific characteristics. Recommended outpatient MRI abdomen for further evaluation as patient declined this during this admission.  - MRI abdomen as an outpatient     Dispo: Anticipated discharge in approximately 1 day.    Burna CashSantos-Sanchez, Hardie Veltre, MD 09/24/2017, 6:27 AM Pager: [phone]

## 2017-09-24 NOTE — Progress Notes (Signed)
Patient  Had soft BP and was waiting for bolus to finish to receive pain meds when she decided she wanted to go home. She stated she did not want to wait for MD. AMA papers signed. MD made aware.

## 2017-09-25 NOTE — Discharge Summary (Signed)
Name: Joanna [name] MRN: [id number] DOB: 02-27-1981 37 y.o. PCP: Patient, No Pcp Per  Date of Admission: 09/21/2017  7:57 AM Date of Discharge: 09/24/2017 Attending Physician: Joanna [name]   Discharge Diagnosis: 1. Sickle cell pain crisis  2. Liver masses   Discharge Medications: Allergies as of 09/24/2017      Reactions   Ondansetron Hives, Itching, Rash   Other reaction(s): Itching   Metoclopramide Hives, Rash   Other reaction(s): Itching Other reaction(s): Hives      Medication List    ASK your doctor about these medications   ADVAIR DISKUS 250-50 MCG/DOSE Aepb Generic drug:  Fluticasone-Salmeterol Inhale 1 puff into the lungs 2 (two) times daily.   DULoxetine 30 MG capsule Commonly known as:  CYMBALTA Take 90 mg by mouth See admin instructions. Take 2 capsules (85m) in th AM and 1 capsule (369m in the evening.   fluticasone 50 MCG/ACT nasal spray Commonly known as:  FLONASE Place 1 spray into the nose as needed.   folic acid 1 MG tablet Commonly known as:  FOLVITE Take 1 mg by mouth daily.   furosemide 20 MG tablet Commonly known as:  LASIX Take 20 mg by mouth daily as needed. For leg swelling   gabapentin 100 MG capsule Commonly known as:  NEURONTIN Take 100 mg by mouth 3 (three) times daily.   hydroxyurea 100 mg/mL Susp Commonly known as:  HYDREA Take 1,000 mg by mouth daily.   methadone 5 MG tablet Commonly known as:  DOLOPHINE Take 5 mg by mouth every 12 (twelve) hours.   NARCAN 4 MG/0.1ML Liqd nasal spray kit Generic drug:  naloxone Place 1 spray into the nose once.   Oxycodone HCl 10 MG Tabs Take 10 mg by mouth 3 (three) times daily as needed for pain.   PROVENTIL HFA 108 (90 Base) MCG/ACT inhaler Generic drug:  albuterol Inhale 2 puffs into the lungs every 6 (six) hours as needed.       Disposition and follow-up:   [name]  At the hospital follow up visit  please address:  1.  Please assess for ongoing lower extremity and lower abdomen pain. Please obtain a CBC to check hemoglobin as last one obtained was 6.7 (lower than baseline of 7-9) and patient declined further work up.   2.  Labs / imaging needed at time of follow-up: CBC to check Hgb   3.  Pending labs/ test needing follow-up: MRI abdomen to evaluate 2 R hepatic lobe masses with non-specific characteristics    Follow-up Appointments: None set up as patient left [facility] problem list:  1. Sickle cell pain crisis: Patient presented with bilateral lower extremity pain, abdominal pain and N/V consistent with previous sickle cell pain crises. She was continued on her home oxycodone and methadone and was provided with IV pain medication for breakthrough pain as well as IV fluids. Her hemoglobin was at baseline (7-9) on admission and was noted to trend down on day prior to discharge, Hgb 6.7. She was however hemodynamically stable and asymptomatic. Patient declined further blood work and left AMA on 6/13.  2. Liver masses: Patient has a history of a right hepatic lobe mass that was noted on MRI abdomen on 10/2016.  On CT abdomen/pelvis obtained on admission she was noted to have two right hepatic lobe masses with non-specific characteristics (see report below). MRI abdomen ordered for further evaluation, but  patient declined. She was recommended to get an MRI abdomen as an outpatient.   Discharge Vitals:   BP (!) 91/58   Pulse 79   Temp 98.6 F (37 C) (Oral)   Resp 13   Ht 4' 11" (1.499 m)   Wt 155 lb (70.3 kg)   LMP 09/07/2017   SpO2 96%   BMI 31.31 kg/m   Pertinent Labs, Studies, and Procedures:  CBC Latest Ref Rng & Units 09/23/2017 09/22/2017 09/21/2017  WBC 4.0 - 10.5 K/uL 14.8(H) 11.9(H) 14.6(H)  Hemoglobin 12.0 - 15.0 g/dL 6.7(LL) 7.5(L) 8.7(L)  Hematocrit 36.0 - 46.0 % 18.7(L) 21.9(L) 25.1(L)  Platelets 150 - 400 K/uL 470(H) 464(H) 471(H)    BMP Latest Ref Rng  & Units 09/22/2017 09/21/2017  Glucose 65 - 99 mg/dL 96 102(H)  BUN 6 - 20 mg/dL 9 9  Creatinine 0.44 - 1.00 mg/dL 0.58 0.58  Sodium 135 - 145 mmol/L 136 136  Potassium 3.5 - 5.1 mmol/L 4.5 4.0  Chloride 101 - 111 mmol/L 105 106  CO2 22 - 32 mmol/L 24 22  Calcium 8.9 - 10.3 mg/dL 8.8(L) 9.0    CT abdomen/pelvis 09/21/2017: IMPRESSION: No acute findings. At least 2 low-attenuation masses in right hepatic lobe which have nonspecific characteristics. These may represent focal nodular hyperplasia or adenomas given patient's age and sex. Abdomen MRI without and with contrast is recommended for further characterization.   Discharge Instructions: No discharge instructions provided as patient left AMA.    SignedWelford Roche, [name] 09/25/2017, 7:46 AM   Pager: [phone]

## 2017-09-27 ENCOUNTER — Emergency Department (HOSPITAL_COMMUNITY)
Admission: EM | Admit: 2017-09-27 | Discharge: 2017-09-27 | Disposition: A | Discharge status: Home / Self Care | Payer: Medicaid Other | Source: Home / Self Care | Attending: Emergency Medicine | Admitting: Emergency Medicine

## 2017-09-27 DIAGNOSIS — Z79899 Other long term (current) drug therapy: Secondary | ICD-10-CM

## 2017-09-27 DIAGNOSIS — D5700 Hb-SS disease with crisis, unspecified: Secondary | ICD-10-CM | POA: Insufficient documentation

## 2017-09-27 DIAGNOSIS — F17210 Nicotine dependence, cigarettes, uncomplicated: Secondary | ICD-10-CM

## 2017-09-27 DIAGNOSIS — J45909 Unspecified asthma, uncomplicated: Secondary | ICD-10-CM

## 2017-09-27 LAB — COMPREHENSIVE METABOLIC PANEL
ALBUMIN: 4 g/dL (ref 3.5–5.0)
ALK PHOS: 126 U/L (ref 38–126)
ALT: 16 U/L (ref 14–54)
AST: 21 U/L (ref 15–41)
Anion gap: 8 (ref 5–15)
BUN: <5 mg/dL — ABNORMAL LOW (ref 6–20)
CO2: 21 mmol/L — AB (ref 22–32)
CREATININE: 0.59 mg/dL (ref 0.44–1.00)
Calcium: 9 mg/dL (ref 8.9–10.3)
Chloride: 106 mmol/L (ref 101–111)
GFR calc non Af Amer: >60 mL/min (ref >60)
GLUCOSE: 102 mg/dL — AB (ref 65–99)
Potassium: 3.3 mmol/L — ABNORMAL LOW (ref 3.5–5.1)
SODIUM: 135 mmol/L (ref 135–145)
Total Bilirubin: 1.7 mg/dL — ABNORMAL HIGH (ref 0.3–1.2)
Total Protein: 7 g/dL (ref 6.5–8.1)

## 2017-09-27 LAB — CBC WITH DIFFERENTIAL/PLATELET
BASOS PCT: 0 %
Basophils Absolute: 0 10*3/uL (ref 0.0–0.1)
Eosinophils Absolute: 0.4 10*3/uL (ref 0.0–0.7)
Eosinophils Relative: 2 %
HCT: 25.4 % — ABNORMAL LOW (ref 36.0–46.0)
HEMOGLOBIN: 8.4 g/dL — AB (ref 12.0–15.0)
Lymphocytes Relative: 28 %
Lymphs Abs: 5.2 10*3/uL — ABNORMAL HIGH (ref 0.7–4.0)
MCH: 33.1 pg (ref 26.0–34.0)
MCHC: 33.1 g/dL (ref 30.0–36.0)
MCV: 100 fL (ref 78.0–100.0)
MONO ABS: 1.1 10*3/uL — AB (ref 0.1–1.0)
Monocytes Relative: 6 %
NEUTROS ABS: 12 10*3/uL — AB (ref 1.7–7.7)
NEUTROS PCT: 64 %
PLATELETS: 584 10*3/uL — AB (ref 150–400)
RBC: 2.54 MIL/uL — ABNORMAL LOW (ref 3.87–5.11)
RDW: 22.6 % — ABNORMAL HIGH (ref 11.5–15.5)
WBC: 18.7 10*3/uL — ABNORMAL HIGH (ref 4.0–10.5)

## 2017-09-27 LAB — I-STAT BETA HCG BLOOD, ED (MC, WL, AP ONLY)

## 2017-09-27 LAB — RETICULOCYTES
RBC.: 2.54 MIL/uL — ABNORMAL LOW (ref 3.87–5.11)
Retic Ct Pct: >23 % — ABNORMAL HIGH (ref 0.4–3.1)

## 2017-09-27 MED ORDER — DIPHENHYDRAMINE HCL 25 MG PO CAPS
25.0000 mg | ORAL_CAPSULE | Freq: Once | ORAL | Status: AC
  Administered 2017-09-27: 25 mg via ORAL
  Filled 2017-09-27: qty 1

## 2017-09-27 MED ORDER — HYDROMORPHONE HCL 2 MG/ML IJ SOLN
2.0000 mg | Freq: Once | INTRAMUSCULAR | Status: AC
  Administered 2017-09-27: 2 mg via INTRAVENOUS
  Filled 2017-09-27: qty 1

## 2017-09-27 MED ORDER — POTASSIUM CHLORIDE CRYS ER 20 MEQ PO TBCR
40.0000 meq | EXTENDED_RELEASE_TABLET | Freq: Once | ORAL | Status: AC
  Administered 2017-09-27: 40 meq via ORAL
  Filled 2017-09-27: qty 2

## 2017-09-27 MED ORDER — HYDROMORPHONE HCL 2 MG/ML IJ SOLN
2.0000 mg | Freq: Once | INTRAMUSCULAR | Status: AC
  Administered 2017-09-27: 2 mg via INTRAMUSCULAR
  Filled 2017-09-27: qty 1

## 2017-09-27 MED ORDER — PROMETHAZINE HCL 25 MG PO TABS
25.0000 mg | ORAL_TABLET | Freq: Once | ORAL | Status: AC
  Administered 2017-09-27: 25 mg via ORAL
  Filled 2017-09-27: qty 1

## 2017-09-27 MED ORDER — KETOROLAC TROMETHAMINE 30 MG/ML IJ SOLN
30.0000 mg | Freq: Once | INTRAMUSCULAR | Status: AC
  Administered 2017-09-27: 30 mg via INTRAMUSCULAR
  Filled 2017-09-27: qty 1

## 2017-09-27 NOTE — ED Provider Notes (Signed)
[facility] EMERGENCY DEPARTMENT Provider Note   CSN: [id number] Arrival date & time: 09/27/17  0139     History   Chief Complaint Chief Complaint  Patient presents with  . Sickle Cell Pain Crisis    HPI Joanna [name] is a 37 y.o. female.  HPI  Patient is a 37 year old female with a history of asthma and sickle cell anemia, followed by [facility] hematology and oncology, presenting for bilateral arm pain, low back pain, and leg pain.  Patient reports that her symptoms are consistent with her prior sickle cell pain crises.  Patient reports that she was recently admitted for sickle cell pain crisis and discharged 3 days ago, and she did have relief of her symptoms, however she began having severe extremity and low back pain last night.  Patient reports that she was too nauseous to take her home pain regimen.  Patient denies any missed doses of hydroxyurea.  Patient denies any fevers, erythematous or swollen joints, weakness or numbness in extremities, abdominal pain, or vomiting.  Patient denies any weakness in lower tremors, numbness, saddle anesthesia, loss of bowel or bladder control, or urinary retention with overflow incontinence.  No IVDU, or cancer history.  Patient denies any chest pain or shortness of breath.  Past Medical History:  Diagnosis Date  . Asthma   . Sickle cell anemia [facility])     Patient Active Problem List   Diagnosis Date Noted  . Sickle cell pain crisis (HCC) 09/21/2017  . Abdominal pain 09/21/2017  . Tobacco abuse 09/21/2017    No past surgical history on file.   OB History   None      Home Medications    Prior to Admission medications   Medication Sig Start Date End Date Taking? Authorizing Provider  albuterol (PROVENTIL HFA) 108 (90 Base) MCG/ACT inhaler Inhale 2 puffs into the lungs every 6 (six) hours as needed for wheezing.  12/20/13  Yes [provider]  DULoxetine (CYMBALTA) 30 MG capsule Take 30-60 mg by mouth See admin  instructions. Take 2 capsules (60mg ) in th AM and 1 capsule (30mg ) in the evening. 09/04/17  Yes [provider]  fluticasone (FLONASE) 50 MCG/ACT nasal spray Place 1 spray into the nose daily as needed for allergies.    Yes [provider]  Fluticasone-Salmeterol (ADVAIR DISKUS) 250-50 MCG/DOSE AEPB Inhale 1 puff into the lungs 2 (two) times daily. 08/26/17  Yes [provider]  folic acid (FOLVITE) 1 MG tablet Take 1 mg by mouth daily.   Yes [provider]  furosemide (LASIX) 20 MG tablet Take 20 mg by mouth daily as needed. For leg swelling 08/26/17  Yes [provider]  gabapentin (NEURONTIN) 100 MG capsule Take 100 mg by mouth 3 (three) times daily. 08/17/17  Yes [provider]  hydroxyurea (HYDREA) 100 mg/mL SUSP Take 1,000 mg by mouth daily.   Yes [provider]  methadone (DOLOPHINE) 5 MG tablet Take 5 mg by mouth every 12 (twelve) hours.   Yes [provider]  naloxone (NARCAN) nasal spray 4 mg/0.1 mL Place 1 spray into the nose as needed (for overdose).  11/21/14  Yes [provider]  Oxycodone HCl 10 MG TABS Take 10 mg by mouth 3 (three) times daily as needed for pain.   Yes [provider]    Family History Family History  Problem Relation Age of Onset  . Diabetes Father     Social History Social History   Tobacco Use  .  Smoking status: Current Every Day Smoker    Packs/day: 1.00  . Smokeless tobacco: Current User  Substance Use Topics  . Alcohol use: Not Currently    Comment: "every blue moon"  . Drug use: Not Currently     Allergies   Ondansetron and Metoclopramide   Review of Systems Review of Systems  Constitutional: Positive for appetite change. Negative for chills, diaphoresis and fever.  HENT: Negative for congestion and rhinorrhea.   Respiratory: Negative for chest tightness and shortness of breath.   Cardiovascular: Negative for chest pain and palpitations.    Gastrointestinal: Positive for nausea. Negative for abdominal pain and vomiting.  Musculoskeletal: Positive for arthralgias and back pain. Negative for joint swelling and neck pain.  Skin: Negative for rash.  Neurological: Negative for weakness and numbness.  All other systems reviewed and are negative.    Physical Exam Updated Vital Signs BP 96/64 (BP Location: Left Arm)   Pulse 84   Temp 98.3 F (36.8 C) (Oral)   Resp 15   Ht 4\' 11"  (1.499 m)   Wt 68 kg (150 lb)   LMP 09/07/2017   SpO2 95%   BMI 30.30 kg/m   Physical Exam  Constitutional: She appears well-developed and well-nourished. No distress.  HENT:  Head: Normocephalic and atraumatic.  Mouth/Throat: Oropharynx is clear and moist.  Eyes: Pupils are equal, round, and reactive to light. Conjunctivae and EOM are normal.  Neck: Normal range of motion. Neck supple.  Cardiovascular: Normal rate, regular rhythm, S1 normal and S2 normal.  No murmur heard. Pulmonary/Chest: Effort normal and breath sounds normal. She has no wheezes. She has no rales.  Abdominal: Soft. She exhibits no distension. There is no tenderness. There is no guarding.  Musculoskeletal: Normal range of motion. She exhibits no edema or deformity.  Bilateral upper lower extremities as well as low back palpated, and patient has diffuse tenderness without focal nature.  TTP primarily in bilateral tibias. Patient has no erythematous or swollen joints of bilateral shoulders, elbows, wrists, knees, or ankles.  Lymphadenopathy:    She has no cervical adenopathy.  Neurological: She is alert.  Cranial nerves grossly intact. Patient moves extremities symmetrically and with good coordination. Normal and symmetric gait.  Skin: Skin is warm and dry. No rash noted. No erythema.  Psychiatric: She has a normal mood and affect. Her behavior is normal. Judgment and thought content normal.  Nursing note and vitals reviewed.    ED Treatments / Results  Labs (all labs  ordered are listed, but only abnormal results are displayed) Labs Reviewed  COMPREHENSIVE METABOLIC PANEL - Abnormal; Notable for the following components:      Result Value   Potassium 3.3 (*)    CO2 21 (*)    Glucose, Bld 102 (*)    BUN <5 (*)    Total Bilirubin 1.7 (*)    All other components within normal limits  CBC WITH DIFFERENTIAL/PLATELET - Abnormal; Notable for the following components:   WBC 18.7 (*)    RBC 2.54 (*)    Hemoglobin 8.4 (*)    HCT 25.4 (*)    RDW 22.6 (*)    Platelets 584 (*)    Neutro Abs 12.0 (*)    Lymphs Abs 5.2 (*)    Monocytes Absolute 1.1 (*)    All other components within normal limits  RETICULOCYTES - Abnormal; Notable for the following components:   Retic Ct Pct >23.0 (*)    RBC. 2.54 (*)    All  other components within normal limits  I-STAT BETA HCG BLOOD, ED ([facility], [facility], [facility] ONLY)    EKG None  Radiology No results found.  Procedures Procedures (including critical care time)  Medications Ordered in ED Medications  HYDROmorphone (DILAUDID) injection 2 mg (2 mg Intramuscular Given 09/27/17 0653)  ketorolac (TORADOL) 30 MG/ML injection 30 mg (30 mg Intramuscular Given 09/27/17 0653)  potassium chloride SA (K-DUR,KLOR-CON) CR tablet 40 mEq (40 mEq Oral Given 09/27/17 0653)  promethazine (PHENERGAN) tablet 25 mg (25 mg Oral Given 09/27/17 0653)  diphenhydrAMINE (BENADRYL) capsule 25 mg (25 mg Oral Given 09/27/17 [id number])     Initial Impression / Assessment and Plan / ED Course  I have reviewed the triage vital signs and the nursing notes.  Pertinent labs & imaging results that were available during my care of the patient were reviewed by me and considered in my medical decision making (see chart for details).  Clinical Course as of Sep 27 904  Sun Sep 27, 2017  0728 Reassessed patient.  Patient reports only slight improvement after initial dose.  Patient requesting a second dose of intramuscular Dilaudid.   [AM]  0730 Likely reactive, given  that patient has ongoing sickle cell crisis this week.  Patient also with thrombocytosis, in addition to elevation in hemoglobin from 4 days ago.  WBC(!): 18.7 [AM]  0731 Repleted today.  Potassium(!): 3.3 [AM]  0731 C/w 3-4 days ago, likely related to ongoing sickle cell crisis this week.   Retic Ct Pct(!): >23.0 [AM]    Clinical Course User Index [AM] Elisha PonderMurray, Taima Rada B, PA-C    Hillary Bowngel Bran is a 37 y.o. female who presents to ED for pain c/w typical sickle cell crisis. No shortness of breath, fevers or signs of acute chest. Patientt otherwise in no acute distress objectively other than complaint of pain. Given IV fluids and pain medication. Will obtain labs and continue to monitor with admit vs. Discharge based on response and results of testing.   Labs overall baseline, with likely reactive changes secondary to reticulocytosis, as above.   Patient re-evaluated and feels much improved. Comfortable with discharge to home. Understands to follow up with PCP and reasons to return to ER. All questions answered. Patient has hematology follow up appointment at the end of the week.   Final Clinical Impressions(s) / ED Diagnoses   Final diagnoses:  Sickle cell pain crisis [facility](HCC)    ED Discharge Orders    None       Delia ChimesMurray, Rexton Greulich B, PA-C 09/27/17 [id number]    Marily MemosMesner, Jason, MD 09/27/17 2303

## 2017-09-27 NOTE — ED Notes (Signed)
Pt ambulated out of ed without difficulty.  AO x 4.

## 2017-09-27 NOTE — Discharge Instructions (Signed)
Please see the information and instructions below regarding your visit.  Your diagnoses today include:  1. Sickle cell pain crisis (HCC)    Tests performed today include: See side panel of your discharge paperwork for testing performed today. Vital signs are listed at the bottom of these instructions.   Medications prescribed:    Take any prescribed medications only as prescribed, and any over the counter medications only as directed on the packaging.  Home care instructions:  Please follow any educational materials contained in this packet.   Follow-up instructions: Please follow-up with your primary care provider this week for further evaluation of your symptoms if they are not completely improved.   Return instructions:  Please return to the Emergency Department if you experience worsening symptoms.  Please return the emergency department if you develop any chest pain, shortness of breath, or worsening pain. Please return if you have any other emergent concerns.  Additional Information:   Your vital signs today were: BP 96/64 (BP Location: Left Arm)    Pulse 84    Temp 98.3 F (36.8 C) (Oral)    Resp 15    Ht 4\' 11"  (1.499 m)    Wt 68 kg (150 lb)    LMP 09/07/2017    SpO2 95%    BMI 30.30 kg/m  If your blood pressure (BP) was elevated on multiple readings during this visit above 130 for the top number or above 80 for the bottom number, please have this repeated by your primary care provider within one month. --------------  Thank you for allowing us to participate in your care today.

## 2017-09-27 NOTE — ED Triage Notes (Signed)
Pt states she began with sickle cell crisis today. Legs are her worst complaint.  Was admitted on Monday for same.

## 2017-09-28 ENCOUNTER — Encounter (HOSPITAL_COMMUNITY): Payer: Self-pay | Admitting: Emergency Medicine

## 2017-09-28 ENCOUNTER — Inpatient Hospital Stay (HOSPITAL_COMMUNITY)
Admission: EM | Admit: 2017-09-28 | Discharge: 2017-10-01 | DRG: 812 | Disposition: A | Discharge status: Home / Self Care | Payer: Medicaid Other | Attending: Internal Medicine | Admitting: Internal Medicine

## 2017-09-28 DIAGNOSIS — Z888 Allergy status to other drugs, medicaments and biological substances status: Secondary | ICD-10-CM | POA: Diagnosis not present

## 2017-09-28 DIAGNOSIS — F17210 Nicotine dependence, cigarettes, uncomplicated: Secondary | ICD-10-CM

## 2017-09-28 DIAGNOSIS — Z5329 Procedure and treatment not carried out because of patient's decision for other reasons: Secondary | ICD-10-CM | POA: Diagnosis not present

## 2017-09-28 DIAGNOSIS — E860 Dehydration: Secondary | ICD-10-CM | POA: Diagnosis not present

## 2017-09-28 DIAGNOSIS — D5700 Hb-SS disease with crisis, unspecified: Principal | ICD-10-CM | POA: Diagnosis present

## 2017-09-28 DIAGNOSIS — R160 Hepatomegaly, not elsewhere classified: Secondary | ICD-10-CM | POA: Diagnosis present

## 2017-09-28 DIAGNOSIS — R011 Cardiac murmur, unspecified: Secondary | ICD-10-CM

## 2017-09-28 DIAGNOSIS — Z79899 Other long term (current) drug therapy: Secondary | ICD-10-CM | POA: Diagnosis not present

## 2017-09-28 DIAGNOSIS — J45909 Unspecified asthma, uncomplicated: Secondary | ICD-10-CM | POA: Diagnosis present

## 2017-09-28 DIAGNOSIS — Z79891 Long term (current) use of opiate analgesic: Secondary | ICD-10-CM

## 2017-09-28 DIAGNOSIS — Z7951 Long term (current) use of inhaled steroids: Secondary | ICD-10-CM | POA: Diagnosis not present

## 2017-09-28 LAB — CBC WITH DIFFERENTIAL/PLATELET
BASOS ABS: 0 10*3/uL (ref 0.0–0.1)
Basophils Relative: 0 %
Eosinophils Absolute: 0.3 10*3/uL (ref 0.0–0.7)
Eosinophils Relative: 2 %
HEMATOCRIT: 23.6 % — AB (ref 36.0–46.0)
Hemoglobin: 7.9 g/dL — ABNORMAL LOW (ref 12.0–15.0)
LYMPHS ABS: 3.3 10*3/uL (ref 0.7–4.0)
Lymphocytes Relative: 22 %
MCH: 33.3 pg (ref 26.0–34.0)
MCHC: 33.5 g/dL (ref 30.0–36.0)
MCV: 99.6 fL (ref 78.0–100.0)
MONO ABS: 1.1 10*3/uL — AB (ref 0.1–1.0)
Monocytes Relative: 7 %
NEUTROS ABS: 10.4 10*3/uL — AB (ref 1.7–7.7)
Neutrophils Relative %: 69 %
PLATELETS: 254 10*3/uL (ref 150–400)
RBC: 2.37 MIL/uL — ABNORMAL LOW (ref 3.87–5.11)
RDW: 20.6 % — AB (ref 11.5–15.5)
WBC: 15.1 10*3/uL — AB (ref 4.0–10.5)

## 2017-09-28 LAB — COMPREHENSIVE METABOLIC PANEL
ALBUMIN: 4 g/dL (ref 3.5–5.0)
ALT: 16 U/L (ref 14–54)
ANION GAP: 6 (ref 5–15)
AST: 18 U/L (ref 15–41)
Alkaline Phosphatase: 111 U/L (ref 38–126)
BILIRUBIN TOTAL: 2.1 mg/dL — AB (ref 0.3–1.2)
BUN: <5 mg/dL — ABNORMAL LOW (ref 6–20)
CHLORIDE: 107 mmol/L (ref 101–111)
CO2: 23 mmol/L (ref 22–32)
Calcium: 8.8 mg/dL — ABNORMAL LOW (ref 8.9–10.3)
Creatinine, Ser: 0.63 mg/dL (ref 0.44–1.00)
GFR calc Af Amer: >60 mL/min (ref >60)
Glucose, Bld: 99 mg/dL (ref 65–99)
POTASSIUM: 3.9 mmol/L (ref 3.5–5.1)
Sodium: 136 mmol/L (ref 135–145)
TOTAL PROTEIN: 6.7 g/dL (ref 6.5–8.1)

## 2017-09-28 LAB — LACTATE DEHYDROGENASE: LDH: 216 U/L — ABNORMAL HIGH (ref 98–192)

## 2017-09-28 MED ORDER — SODIUM CHLORIDE 0.45 % IV SOLN
INTRAVENOUS | Status: DC
  Administered 2017-09-28 – 2017-10-01 (×8): via INTRAVENOUS

## 2017-09-28 MED ORDER — FOLIC ACID 1 MG PO TABS
1.0000 mg | ORAL_TABLET | Freq: Every day | ORAL | Status: DC
Start: 2017-09-28 — End: 2017-10-01
  Administered 2017-09-28 – 2017-10-01 (×4): 1 mg via ORAL
  Filled 2017-09-28 (×4): qty 1

## 2017-09-28 MED ORDER — ACETAMINOPHEN 325 MG PO TABS: 650.0000 mg | ORAL_TABLET | Freq: Four times a day (QID) | ORAL | Status: DC | PRN

## 2017-09-28 MED ORDER — PROMETHAZINE HCL 25 MG PO TABS
25.0000 mg | ORAL_TABLET | ORAL | Status: DC | PRN
  Administered 2017-09-28: 25 mg via ORAL
  Filled 2017-09-28: qty 1

## 2017-09-28 MED ORDER — HYDROMORPHONE HCL 1 MG/ML IJ SOLN
2.0000 mg | INTRAMUSCULAR | Status: DC | PRN
  Administered 2017-09-28 – 2017-10-01 (×24): 2 mg via INTRAVENOUS
  Filled 2017-09-28 (×28): qty 2

## 2017-09-28 MED ORDER — METHADONE HCL 10 MG PO TABS
5.0000 mg | ORAL_TABLET | Freq: Two times a day (BID) | ORAL | Status: DC
  Administered 2017-09-28 – 2017-09-30 (×6): 5 mg via ORAL
  Filled 2017-09-28 (×5): qty 1

## 2017-09-28 MED ORDER — GABAPENTIN 100 MG PO CAPS
100.0000 mg | ORAL_CAPSULE | Freq: Three times a day (TID) | ORAL | Status: DC
  Administered 2017-09-28 – 2017-10-01 (×9): 100 mg via ORAL
  Filled 2017-09-28 (×9): qty 1

## 2017-09-28 MED ORDER — OXYCODONE HCL 5 MG PO TABS
10.0000 mg | ORAL_TABLET | Freq: Three times a day (TID) | ORAL | Status: DC
  Administered 2017-09-28 – 2017-10-01 (×9): 10 mg via ORAL
  Filled 2017-09-28 (×9): qty 2

## 2017-09-28 MED ORDER — NALOXONE HCL 4 MG/0.1ML NA LIQD
1.0000 | NASAL | Status: DC | PRN
  Filled 2017-09-28 (×2): qty 8

## 2017-09-28 MED ORDER — SODIUM CHLORIDE 0.9 % IV BOLUS
1000.0000 mL | Freq: Once | INTRAVENOUS | Status: AC
  Administered 2017-09-28: 1000 mL via INTRAVENOUS

## 2017-09-28 MED ORDER — HYDROXYUREA 500 MG PO CAPS
1000.0000 mg | ORAL_CAPSULE | Freq: Every day | ORAL | Status: DC
  Filled 2017-09-28 (×3): qty 2

## 2017-09-28 MED ORDER — SODIUM CHLORIDE 0.9% FLUSH
3.0000 mL | Freq: Two times a day (BID) | INTRAVENOUS | Status: DC
  Administered 2017-09-29 – 2017-09-30 (×2): 3 mL via INTRAVENOUS

## 2017-09-28 MED ORDER — DULOXETINE HCL 60 MG PO CPEP
60.0000 mg | ORAL_CAPSULE | Freq: Every day | ORAL | Status: DC
  Administered 2017-09-28 – 2017-10-01 (×4): 60 mg via ORAL
  Filled 2017-09-28 (×4): qty 1

## 2017-09-28 MED ORDER — DULOXETINE HCL 30 MG PO CPEP
30.0000 mg | ORAL_CAPSULE | Freq: Every day | ORAL | Status: DC
  Administered 2017-09-28 – 2017-09-30 (×3): 30 mg via ORAL
  Filled 2017-09-28 (×4): qty 1

## 2017-09-28 MED ORDER — DULOXETINE HCL 30 MG PO CPEP: 30.0000 mg | ORAL_CAPSULE | ORAL | Status: DC

## 2017-09-28 MED ORDER — HYDROMORPHONE HCL 2 MG/ML IJ SOLN: 2.0000 mg | INTRAMUSCULAR | Status: AC

## 2017-09-28 MED ORDER — HYDROMORPHONE HCL 2 MG/ML IJ SOLN: 2.0000 mg | INTRAMUSCULAR | Status: DC

## 2017-09-28 MED ORDER — ACETAMINOPHEN 650 MG RE SUPP: 650.0000 mg | Freq: Four times a day (QID) | RECTAL | Status: DC | PRN

## 2017-09-28 MED ORDER — HYDROMORPHONE HCL 2 MG/ML IJ SOLN
2.0000 mg | INTRAMUSCULAR | Status: AC
  Administered 2017-09-28: 2 mg via SUBCUTANEOUS

## 2017-09-28 MED ORDER — FLEET ENEMA 7-19 GM/118ML RE ENEM: 1.0000 | ENEMA | Freq: Once | RECTAL | Status: DC | PRN

## 2017-09-28 MED ORDER — KETOROLAC TROMETHAMINE 30 MG/ML IJ SOLN
30.0000 mg | Freq: Once | INTRAMUSCULAR | Status: AC
  Administered 2017-09-28: 30 mg via INTRAVENOUS
  Filled 2017-09-28: qty 1

## 2017-09-28 MED ORDER — ENOXAPARIN SODIUM 40 MG/0.4ML ~~LOC~~ SOLN
40.0000 mg | SUBCUTANEOUS | Status: DC
  Administered 2017-09-29: 40 mg via SUBCUTANEOUS
  Filled 2017-09-28 (×4): qty 0.4

## 2017-09-28 MED ORDER — HYDROMORPHONE HCL 2 MG/ML IJ SOLN
2.0000 mg | INTRAMUSCULAR | Status: DC
  Filled 2017-09-28: qty 1

## 2017-09-28 MED ORDER — NICOTINE 14 MG/24HR TD PT24
14.0000 mg | MEDICATED_PATCH | Freq: Every day | TRANSDERMAL | Status: DC
  Filled 2017-09-28: qty 1

## 2017-09-28 MED ORDER — DIPHENHYDRAMINE HCL 25 MG PO CAPS
25.0000 mg | ORAL_CAPSULE | ORAL | Status: DC | PRN
  Administered 2017-09-28: 25 mg via ORAL
  Filled 2017-09-28: qty 1

## 2017-09-28 MED ORDER — HYDROMORPHONE HCL 2 MG/ML IJ SOLN
2.0000 mg | INTRAMUSCULAR | Status: AC
  Administered 2017-09-28: 2 mg via INTRAVENOUS
  Filled 2017-09-28: qty 1

## 2017-09-28 MED ORDER — HYDROMORPHONE HCL 2 MG/ML IJ SOLN
2.0000 mg | INTRAMUSCULAR | Status: AC
  Administered 2017-09-28: 2 mg via SUBCUTANEOUS
  Filled 2017-09-28: qty 1

## 2017-09-28 MED ORDER — SODIUM CHLORIDE 0.45 % IV SOLN: INTRAVENOUS | Status: DC

## 2017-09-28 MED ORDER — HYDROMORPHONE HCL 2 MG/ML IJ SOLN
2.0000 mg | INTRAMUSCULAR | Status: DC | PRN
  Administered 2017-09-28 (×2): 2 mg via INTRAVENOUS
  Filled 2017-09-28 (×2): qty 1

## 2017-09-28 NOTE — ED Notes (Signed)
Lunch tray ordered 

## 2017-09-28 NOTE — ED Notes (Signed)
Notification sent to pharmacy to verify medications 

## 2017-09-28 NOTE — ED Notes (Signed)
Patient is in bed this morning, very sleepy- has difficulty  staying awake during assessment. No signs/symptoms of discomfort observed. Will continue to re-assess.

## 2017-09-28 NOTE — ED Provider Notes (Signed)
Assumed care of this pt at sign out. 37 y.o with pmhx of sickle cell anemia, here with bilateral upper and lower extremity pain and lower back pain. She was seen in the ED 2 days ago and was given pain medication an discharged to home. SHe has received total of 6mg  of dilaudid and continuous IV, phenergan and PO benadryl. She continues to have pain and does not feel that she can control this at home with her PO oxy and methadone. ED physician Dr. Preston FleetingGlick recommended taht she be admitted for pain control. Her BP is soft but she is mentating well. No chest pain or SOB. Cr normal, I will give dose of toradol. Hgb 7.9 which seems to be her baseline, will hold off on any blood transfusion. Retic count from yesterday is very elevated.   Will consult hospitalist for admission.  10:11 AM Spoke with internal medicine service who will admit pt.    Dowless, Lester KinsmanSamantha Tripp, PA-C 09/28/17 1012    Gerhard MunchLockwood, Robert, MD 09/28/17 [phone]

## 2017-09-28 NOTE — ED Notes (Signed)
Pt wakes from sleep to say her pain is "good " and rates at 6/10

## 2017-09-28 NOTE — H&P (Signed)
Date: 09/28/2017               Patient Name:  Joanna [name] MRN: [id number]  DOB: 11/25/1980 Age / Sex: 37 y.o., female   PCP: Patient, No Pcp Per         Medical Service: Internal Medicine Teaching Service         Attending Physician: Dr. Earl Lagos, MD    First Contact: Dr. Evelene Croon Pager: [phone]  Second Contact: Dr. 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 She was recently admitted for sickle cell pain crisis (presented with same symptoms at the time) and left AMA on 6/13. She was also seen in the ED yesterday for same and was sent home after improvement with 3 doses of IM Dilaudid.  She presented to the ED again last night with similar complaints and failed to improve after a total of 6 mg of IV Dilaudid, phenergan and PO benadryl. States she has been having poor PO intake for the past 2 days due to nausea and poor appetite. Has been compliant with home medications though. She also reports sick contacts at home. Has 2 nephews with a cold, but denies rhinorrhea, sore throat, and cough. She does endorse dyspnea, but states it is not worse than baseline. She also denies chest pain and urinary symptoms. Last BM was 2 days ago, which is normal for her.   ED course: Patient was afebrile and hemodynamically stable on arrival to the ED. Blood work remarkable for WBC 15.18 and Hgb 7.9 (baseline 7-9). Retic count from ED visit yesterday was 2.54. She received 6 mg of IV Dilaudid, benadryl and Toradol x1. She did not receive IV fluids.   Meds:  Current Meds  Medication Sig  . albuterol (PROVENTIL HFA) 108 (90 Base)  MCG/ACT inhaler Inhale 2 puffs into the lungs every 6 (six) hours as needed for wheezing.   . DULoxetine (CYMBALTA) 30 MG capsule Take 30-60 mg by mouth See admin instructions. Take 2 capsules (60mg ) in th AM and 1 capsule (30mg ) in the evening.  . fluticasone (FLONASE) 50 MCG/ACT nasal spray Place 1 spray into the nose daily as needed for allergies.   . Fluticasone-Salmeterol (ADVAIR DISKUS) 250-50 MCG/DOSE AEPB Inhale 1 puff into the lungs 2 (two) times daily.  . folic acid (FOLVITE) 1 MG tablet Take 1 mg by mouth daily.  . furosemide (LASIX) 20 MG tablet Take 20 mg by mouth daily as needed. For leg swelling  . gabapentin (NEURONTIN) 100 MG capsule Take 100 mg by mouth 3 (three) times daily.  . hydroxyurea (HYDREA) 100 mg/mL SUSP Take 1,000 mg by mouth daily.  . methadone (DOLOPHINE) 5 MG tablet Take 5 mg by mouth every 12 (twelve) hours.  . naloxone (NARCAN) nasal spray 4 mg/0.1 mL Place 1 spray into the nose as needed (for overdose).   . Oxycodone HCl 10 MG TABS Take 10 mg by mouth 3 (three) times daily as needed for pain.     Allergies: Allergies as of 09/28/2017 - Review Complete 09/28/2017  Allergen Reaction Noted  . Ondansetron Hives,  Itching, and Rash 09/28/2005  . Metoclopramide Hives and Rash 09/16/2010   Past Medical History:  Diagnosis Date  . Asthma   . Sickle cell anemia (HCC)     Family History:  Family History  Problem Relation Age of Onset  . Diabetes Father     Social History:  Social History   Socioeconomic History  . Marital status: Single    Spouse name: Not on file  . Number of children: Not on file  . Years of education: Not on file  . Highest education level: Not on file  Occupational History  . Not on file  Social Needs  . Financial resource strain: Not on file  . Food insecurity:    Worry: Not on file    Inability: Not on file  . Transportation needs:    Medical: Not on file    Non-medical: Not on file  Tobacco Use  . Smoking status:  Current Every Day Smoker    Packs/day: 1.00  . Smokeless tobacco: Current User  Substance and Sexual Activity  . Alcohol use: Not Currently    Comment: "every blue moon"  . Drug use: Not Currently  . Sexual activity: Not on file  Lifestyle  . Physical activity:    Days per week: Not on file    Minutes per session: Not on file  . Stress: Not on file  Relationships  . Social connections:    Talks on phone: Not on file    Gets together: Not on file    Attends religious service: Not on file    Active member of club or organization: Not on file    Attends meetings of clubs or organizations: Not on file    Relationship status: Not on file  Other Topics Concern  . Not on file  Social History Narrative  . Not on file    Review of Systems: A complete ROS was negative except as per HPI.   Physical Exam: Blood pressure 101/64, pulse 75, temperature 98.3 F (36.8 C), temperature source Oral, resp. rate 16, height 4\' 11"  (1.499 m), weight 150 lb (68 kg), last menstrual period 09/07/2017, SpO2 94 %.  Physical Exam  Constitutional: She is oriented to person, place, and time and well-developed, well-nourished, and in no distress.  Eyes: Pupils are equal, round, and reactive to light. Scleral icterus is present.  Neck: Normal range of motion. Neck supple.  Cardiovascular: Normal rate and regular rhythm. Exam reveals no gallop and no friction rub.  Murmur (II/VI SEM ) heard. Pulmonary/Chest: Effort normal and breath sounds normal. No respiratory distress. She has no wheezes. She has no rales.  Abdominal: Soft. Bowel sounds are normal. She exhibits no distension. There is no tenderness.  Musculoskeletal: She exhibits edema (Trace pedal edema on L foot) and tenderness (Tender over bilateral lower extremities ).  Neurological: She is alert and oriented to person, place, and time.  Skin: Skin is dry. No rash noted. No erythema.    EKG: none obtained   CXR: none obtained   Assessment &  Plan by Problem: Active Problems:   Sickle cell crisis (HCC)  # Sickle cell pain crisis: Patient presents with bilateral UE + LE pain and lower back pain that she describes as typical during her pain crisis. Compliant with home oxycodone and methadone with minimal relief in pain. Suspect secondary to dehydration as she reports poor PO intake x 2 days. Will NS bolus and start maintenance fluids as well. Hemoglobin currently at baseline.  Will continue to monitor daily. Follows up with [facility] at [facility] and was admitted 3-4 months ago with similar symptoms requiring 2 blood transfusions.   - 1L NS bolus + mIVF 1/2NS @ 150 cc/hr  - IV Dilaudid 2 mg q2h PRN. Narcan available if needed. Plan to switch to PCA if poor pain control on current regimen  - Continue home oxy 10 TID PRN and methadone 5 mg BID PRN  - Continue home Cymbalta 90 mg QD and gabapentin 100 mg TID  - Continue home hydroxyurea  - Follow up LDH and haptoglobin   # Hepatic masses: Noted to have a 2.8x3.4 cm R hepatic lobe mass with nonspecific characteristics on MRI abdomen in 10/2016. Per report, does not have the appearance of a simple cyst or hemangioma. CT abdomen/pelvis on previous admission with 2 noted R hepatic lobe masses (1 know and 1 new) also described as nonspecific characteristics. Recommended outpatient MRI abdomen for further evaluation as patient declined this during this admission.  - MRI abdomen as an outpatient   # Tobacco abuse:  - Nicotine patch 14 mg QD   F: 1L NS bolus + 1/2NS @ 150 cc/hr  E: monitoring and repleting PRN  N: Regular diet   VTE ppx: SQ enoxaparin   Code status: Full code, not confirmed on admission   Dispo: Admit patient to Inpatient with expected length of stay greater than 2 midnights.  SignedNoemi Chapel, DO 09/28/2017, 10:35 AM  Pager: [phone]

## 2017-09-28 NOTE — ED Notes (Signed)
Blood drawn, Sent Lavender tube to lab as requested

## 2017-09-28 NOTE — ED Triage Notes (Signed)
Pt reports bilateral arm and leg pain present since yesterday, hx of sickle cell. Was seen here yesterday for same and recently admitted.

## 2017-09-28 NOTE — ED Notes (Signed)
Attempted IV access x2, unsuccessful.

## 2017-09-28 NOTE — ED Provider Notes (Addendum)
[facility] Inpatient EMERGENCY DEPARTMENT Provider Note   CSN: [id number] Arrival date & time: 09/28/17  0045     History   Chief Complaint Chief Complaint  Patient presents with  . Sickle Cell Pain Crisis    HPI Joanna [name] is a 37 y.o. female.  Patient presents to the emergency department with a chief complaint of sickle cell pain.  She states that she typically gets her sickle cell pain in her lower extremities and low back.  This is where her symptoms are occurring now.  She states that she was seen yesterday for the same, and requested IM shots because she wanted to go home.  She states that this helped a little, but that her symptoms returned worse today.  She has taken oxycodone and methadone at home for this with little relief.  She is followed at [facility], and sees her hematologist on Friday of this week.  She rates her pain is severe.  She denies any fever, chills, chest pain, shortness of breath.  She reports having some swelling in her left ankle.  This is also typical for her sickle cell pain crises.  The history is provided by the patient. No language interpreter was used.    Past Medical History:  Diagnosis Date  . Asthma   . Sickle cell anemia [facility])     Patient Active Problem List   Diagnosis Date Noted  . Sickle cell pain crisis (HCC) 09/21/2017  . Abdominal pain 09/21/2017  . Tobacco abuse 09/21/2017    History reviewed. No pertinent surgical history.   OB History   None      Home Medications    Prior to Admission medications   Medication Sig Start Date End Date Taking? Authorizing Provider  albuterol (PROVENTIL HFA) 108 (90 Base) MCG/ACT inhaler Inhale 2 puffs into the lungs every 6 (six) hours as needed for wheezing.  12/20/13   [provider]  DULoxetine (CYMBALTA) 30 MG capsule Take 30-60 mg by mouth See admin instructions. Take 2 capsules (60mg ) in th AM and 1 capsule (30mg ) in the evening. 09/04/17   [provider]    fluticasone (FLONASE) 50 MCG/ACT nasal spray Place 1 spray into the nose daily as needed for allergies.     [provider]  Fluticasone-Salmeterol (ADVAIR DISKUS) 250-50 MCG/DOSE AEPB Inhale 1 puff into the lungs 2 (two) times daily. 08/26/17   [provider]  folic acid (FOLVITE) 1 MG tablet Take 1 mg by mouth daily.    [provider]  furosemide (LASIX) 20 MG tablet Take 20 mg by mouth daily as needed. For leg swelling 08/26/17   [provider]  gabapentin (NEURONTIN) 100 MG capsule Take 100 mg by mouth 3 (three) times daily. 08/17/17   [provider]  hydroxyurea (HYDREA) 100 mg/mL SUSP Take 1,000 mg by mouth daily.    [provider]  methadone (DOLOPHINE) 5 MG tablet Take 5 mg by mouth every 12 (twelve) hours.    [provider]  naloxone [facility]) nasal spray 4 mg/0.1 mL Place 1 spray into the nose as needed (for overdose).  11/21/14   [provider]  Oxycodone HCl 10 MG TABS Take 10 mg by mouth 3 (three) times daily as needed for pain.    [provider]    Family History Family History  Problem Relation Age of Onset  . Diabetes Father     Social History Social History   Tobacco Use  . Smoking status:  Current Every Day Smoker    Packs/day: 1.00  . Smokeless tobacco: Current User  Substance Use Topics  . Alcohol use: Not Currently    Comment: "every blue moon"  . Drug use: Not Currently     Allergies   Ondansetron and Metoclopramide   Review of Systems Review of Systems  All other systems reviewed and are negative.    Physical Exam Updated Vital Signs BP 120/72 (BP Location: Right Arm)   Pulse 87   Temp 98.5 F (36.9 C)   Resp 15   Ht 4\' 11"  (1.499 m)   Wt 68 kg (150 lb)   LMP 09/07/2017   SpO2 100%   BMI 30.30 kg/m   Physical Exam  Constitutional: She is oriented to person, place, and time. She appears well-developed and well-nourished.  HENT:  Head: Normocephalic and  atraumatic.  Eyes: Pupils are equal, round, and reactive to light. Conjunctivae and EOM are normal.  Neck: Normal range of motion. Neck supple.  Cardiovascular: Normal rate and regular rhythm. Exam reveals no gallop and no friction rub.  No murmur heard. Pulmonary/Chest: Effort normal and breath sounds normal. No respiratory distress. She has no wheezes. She has no rales. She exhibits no tenderness.  Abdominal: Soft. Bowel sounds are normal. She exhibits no distension and no mass. There is no tenderness. There is no rebound and no guarding.  Musculoskeletal: Normal range of motion. She exhibits no edema or tenderness.  Mild swelling of the left ankle, but without erythema  Neurological: She is alert and oriented to person, place, and time.  Skin: Skin is warm and dry.  Psychiatric: She has a normal mood and affect. Her behavior is normal. Judgment and thought content normal.  Nursing note and vitals reviewed.    ED Treatments / Results  Labs (all labs ordered are listed, but only abnormal results are displayed) Labs Reviewed  CBC WITH DIFFERENTIAL/PLATELET  COMPREHENSIVE METABOLIC PANEL  RETICULOCYTES    EKG None  Radiology No results found.  Procedures Procedures (including critical care time)  Medications Ordered in ED Medications  0.45 % sodium chloride infusion (has no administration in time range)  HYDROmorphone (DILAUDID) injection 2 mg (has no administration in time range)    Or  HYDROmorphone (DILAUDID) injection 2 mg (has no administration in time range)  HYDROmorphone (DILAUDID) injection 2 mg (has no administration in time range)    Or  HYDROmorphone (DILAUDID) injection 2 mg (has no administration in time range)  HYDROmorphone (DILAUDID) injection 2 mg (has no administration in time range)    Or  HYDROmorphone (DILAUDID) injection 2 mg (has no administration in time range)  HYDROmorphone (DILAUDID) injection 2 mg (has no administration in time range)    Or    HYDROmorphone (DILAUDID) injection 2 mg (has no administration in time range)  promethazine (PHENERGAN) tablet 25 mg (has no administration in time range)  diphenhydrAMINE (BENADRYL) capsule 25-50 mg (has no administration in time range)     Initial Impression / Assessment and Plan / ED Course  I have reviewed the triage vital signs and the nursing notes.  Pertinent labs & imaging results that were available during my care of the patient were reviewed by me and considered in my medical decision making (see chart for details).    Patient with sickle cell.  She was seen here yesterday for the same.  She has had worsening lower extremity pain and low back pain.  She reports that she has also felt nauseated.  Will  check labs, treat pain, and reassess.  Delay in getting labs due to hard stick.  Patient signed out to [facility], PA-C, who will continue care.  Final Clinical Impressions(s) / ED Diagnoses   Final diagnoses:  Sickle cell pain crisis [facility])    ED Discharge Orders    None        Roxy Horseman, PA-C 09/28/17 0601    Dione Booze, MD 09/28/17 [phone]

## 2017-09-29 ENCOUNTER — Other Ambulatory Visit: Payer: Self-pay

## 2017-09-29 LAB — COMPREHENSIVE METABOLIC PANEL
ALT: 13 U/L — ABNORMAL LOW (ref 14–54)
AST: 16 U/L (ref 15–41)
Albumin: 3.5 g/dL (ref 3.5–5.0)
Alkaline Phosphatase: 90 U/L (ref 38–126)
Anion gap: 4 — ABNORMAL LOW (ref 5–15)
BUN: 7 mg/dL (ref 6–20)
CO2: 22 mmol/L (ref 22–32)
Calcium: 8.4 mg/dL — ABNORMAL LOW (ref 8.9–10.3)
Chloride: 110 mmol/L (ref 101–111)
Creatinine, Ser: 0.69 mg/dL (ref 0.44–1.00)
Glucose, Bld: 93 mg/dL (ref 65–99)
POTASSIUM: 4.5 mmol/L (ref 3.5–5.1)
Sodium: 136 mmol/L (ref 135–145)
Total Bilirubin: 2.3 mg/dL — ABNORMAL HIGH (ref 0.3–1.2)
Total Protein: 5.9 g/dL — ABNORMAL LOW (ref 6.5–8.1)

## 2017-09-29 LAB — CBC
HEMATOCRIT: 19.1 % — AB (ref 36.0–46.0)
Hemoglobin: 6.4 g/dL — CL (ref 12.0–15.0)
MCH: 32 pg (ref 26.0–34.0)
MCHC: 33.5 g/dL (ref 30.0–36.0)
MCV: 95.5 fL (ref 78.0–100.0)
Platelets: 504 10*3/uL — ABNORMAL HIGH (ref 150–400)
RBC: 2 MIL/uL — AB (ref 3.87–5.11)
RDW: 20.6 % — ABNORMAL HIGH (ref 11.5–15.5)
WBC: 10.9 10*3/uL — AB (ref 4.0–10.5)

## 2017-09-29 LAB — HAPTOGLOBIN

## 2017-09-29 NOTE — Progress Notes (Signed)
   Subjective:  No acute events overnight. Continues to have bilateral UE and LE pain as well as lower back pain, but improving with IV Dilaudid PRN. Also continues to have nausea, but having good PO intake.   Objective:  Vital signs in last 24 hours: Vitals:   09/28/17 1658 09/28/17 1723 09/28/17 2336 09/29/17 0830  BP: 108/76 101/69 111/75 95/69  Pulse: 70 71 83 71  Resp: 14  17 18   Temp:  98.3 F (36.8 C) 98.4 F (36.9 C)   TempSrc:  Oral Oral   SpO2: 96% 100% 100% 100%  Weight:  158 lb 11.7 oz (72 kg)    Height:  4\' 11"  (1.499 m)     Physical Exam  Constitutional: She is oriented to person, place, and time and well-developed, well-nourished, and in no distress.  Cardiovascular: Normal rate, regular rhythm and normal heart sounds. Exam reveals no gallop and no friction rub.  No murmur heard. Pulmonary/Chest: Effort normal and breath sounds normal. No respiratory distress. She has no wheezes. She has no rales.  Abdominal: Bowel sounds are normal. She exhibits no distension. There is no tenderness.  Musculoskeletal: She exhibits tenderness (Tenderness over bilateral lower extremities ). She exhibits no edema.  Neurological: She is alert and oriented to person, place, and time.    Assessment/Plan:  Active Problems:   Sickle cell crisis (HCC)   Sickle cell anemia with pain (HCC)  # Sickle cell pain crisis:Pain mildly improved from yesterday. Will continue conservative management with IV fluid hydration and IV analgesia for breakthrough pain as well as home pain medicine. Hemoglobin trended down, 7.4--> 6.4. VSS at this time. LDH and haptoglobin at baseline. Will continue to monitor and transfuse if Hgb <6. - Continue mIVF 1/2NS @ 150 cc/hr  - IV Dilaudid 2 mg q2h PRN. Narcan available if needed. Plan to switch to PCA if poor pain control on current regimen  - Continue home oxy 10 TID PRN and methadone 5 mg BID PRN  - Continue home Cymbalta 90 mg QD and gabapentin 100 mg TID    - Continue home hydroxyurea   # Hepatic masses: - MRI abdomenas an outpatient   Dispo: Anticipated discharge in approximately 1-3 day(s) pending improvement in pain.   Burna CashSantos-Sanchez, Boleslaw Borghi, MD 09/29/2017, 10:40 AM Pager: [phone]

## 2017-09-29 NOTE — Progress Notes (Signed)
Patient is refusing blood work and is still refusing blood transfusion. Provider Santos-Sanchez made aware. Will pass on information to daytime nurse.

## 2017-09-29 NOTE — Progress Notes (Signed)
This RN received a call from lab about the patient's critical lab value of 6.4 hemoglobin at 506. This RN then notified provider on call. Provider returned paged and instructed this RN about starting blood transfusion products. This RN then went to patient room and educated the patient about her low hemoglobin and about the plan to transfuse blood products. After educating the patient and answering all her questions she denied the blood transfusion. She verbally said NO to the transfusion and expressed some disinterest. The provider on call was then notified soon after about the patient's decision. The provider returned the paged and spoke to this nurse over the phone and said OK he understood and gave no further orders.

## 2017-09-30 LAB — BASIC METABOLIC PANEL
Anion gap: 4 — ABNORMAL LOW (ref 5–15)
BUN: 7 mg/dL (ref 6–20)
CALCIUM: 8.7 mg/dL — AB (ref 8.9–10.3)
CO2: 26 mmol/L (ref 22–32)
CREATININE: 0.54 mg/dL (ref 0.44–1.00)
Chloride: 107 mmol/L (ref 101–111)
Glucose, Bld: 84 mg/dL (ref 65–99)
Potassium: 4.4 mmol/L (ref 3.5–5.1)
Sodium: 137 mmol/L (ref 135–145)

## 2017-09-30 LAB — CBC
HCT: 19.1 % — ABNORMAL LOW (ref 36.0–46.0)
HEMOGLOBIN: 6.4 g/dL — AB (ref 12.0–15.0)
MCH: 31.5 pg (ref 26.0–34.0)
MCHC: 33.5 g/dL (ref 30.0–36.0)
MCV: 94.1 fL (ref 78.0–100.0)
PLATELETS: 518 10*3/uL — AB (ref 150–400)
RBC: 2.03 MIL/uL — ABNORMAL LOW (ref 3.87–5.11)
RDW: 21.2 % — ABNORMAL HIGH (ref 11.5–15.5)
WBC: 15 10*3/uL — ABNORMAL HIGH (ref 4.0–10.5)

## 2017-09-30 NOTE — Progress Notes (Signed)
   Subjective:  No acute events overnight.  Patient continues to report improvement in bilateral lower extremity pain as well as lower back pain.  Denies upper extremity pain.  Nausea is also improved and she is tolerating p.o. intake well.  Objective:  Vital signs in last 24 hours: Vitals:   09/29/17 1634 09/30/17 0001 09/30/17 0422 09/30/17 0800  BP: (!) 84/62 99/63  94/69  Pulse: 77 73  75  Resp:  18  14  Temp: 98.7 F (37.1 C) 98 F (36.7 C)  98.4 F (36.9 C)  TempSrc: Oral Oral  Oral  SpO2: 100% 99%  100%  Weight:   156 lb 1.4 oz (70.8 kg)   Height:       Physical Exam  Constitutional: She is oriented to person, place, and time and well-developed, well-nourished, and in no distress.  Cardiovascular: Normal rate and regular rhythm. Exam reveals no gallop and no friction rub.  Murmur (II/VI SEM unchanged ) heard. Pulmonary/Chest: Effort normal and breath sounds normal. No respiratory distress. She has no wheezes. She has no rales.  Abdominal: Soft. Bowel sounds are normal. She exhibits no distension. There is no tenderness.  Musculoskeletal: She exhibits tenderness (mild TTP bilateral LE ). She exhibits no edema.  Neurological: She is alert and oriented to person, place, and time.    Assessment/Plan:  Active Problems:   Sickle cell crisis (HCC)   Sickle cell anemia with pain (HCC)  # Sickle cell pain crisis:Pain continues to improve on current regimen. Will continue conservative management with IV fluid hydration and IV analgesia for breakthrough pain as well as home pain medicine. Hemoglobin stable 6.4 form eysterday. VSS at this time.  Will continue to monitor and transfuse if Hgb <6. - Continue mIVF 1/2NS @ 150 cc/hr - IV Dilaudid 2 mg q2h PRN.Narcan available if needed.Plan to switch to PCA if poor pain control on current regimen  - Continue home oxy 10 TID PRN and methadone 5 mg BID PRN  - Continue home Cymbalta 90 mg QD and gabapentin 100 mg TID - Continue home  hydroxyurea  # Hepatic masses: - MRI abdomenas an outpatient   Dispo: Anticipated discharge in approximately 1-2 day(s) pending improvement in pain.   Burna CashSantos-Sanchez, Treyshawn Muldrew, MD 09/30/2017, 4:41 PM Pager: [phone]

## 2017-10-01 LAB — CBC
HCT: 18.5 % — ABNORMAL LOW (ref 36.0–46.0)
HEMOGLOBIN: 6.2 g/dL — AB (ref 12.0–15.0)
MCH: 32.1 pg (ref 26.0–34.0)
MCHC: 33.5 g/dL (ref 30.0–36.0)
MCV: 95.9 fL (ref 78.0–100.0)
Platelets: 504 10*3/uL — ABNORMAL HIGH (ref 150–400)
RBC: 1.93 MIL/uL — AB (ref 3.87–5.11)
RDW: 22.2 % — ABNORMAL HIGH (ref 11.5–15.5)
WBC: 14.8 10*3/uL — ABNORMAL HIGH (ref 4.0–10.5)

## 2017-10-01 NOTE — Progress Notes (Signed)
   Subjective:  No acute events overnight. Patient feeling at baseline this morning. Denies pain. Requesting to go home.   Objective:  Vital signs in last 24 hours: Vitals:   09/30/17 0800 09/30/17 1732 09/30/17 2348 10/01/17 0600  BP: 94/69 110/76 96/60   Pulse: 75 78 78   Resp: 14 16 18    Temp: 98.4 F (36.9 C) 98.7 F (37.1 C) 98.6 F (37 C)   TempSrc: Oral Oral Oral   SpO2: 100% 100% 100%   Weight:    151 lb 10.8 oz (68.8 kg)  Height:       Physical Exam  Constitutional: She is oriented to person, place, and time and well-developed, well-nourished, and in no distress.  Cardiovascular: Normal rate and regular rhythm. Exam reveals no gallop and no friction rub.  Murmur (II/VI SEM unchanged ) heard. Pulmonary/Chest: Effort normal and breath sounds normal. No respiratory distress. She has no wheezes. She has no rales.  Musculoskeletal: She exhibits no edema.  Neurological: She is alert and oriented to person, place, and time.    Assessment/Plan:  Active Problems:   Sickle cell crisis (HCC)   Sickle cell anemia with pain (HCC)  # Sickle cell pain crisis: Patient denies any pain this morning and is requesting to go home.  Patient has an appointment at [facility]NT tomorrow with her heme-onc doctor.  Hemoglobin 6.2 this morning.  VSS. She is medically stable for discharge today. -ContinuemIVF 1/2NS @ 150 cc/hr - IV Dilaudid 2 mg q2h PRN.Narcan available if needed.Plan to switch to PCA if poor pain control on current regimen  - Continue home oxy 10 TID PRN and methadone 5 mg BID PRN  - Continue home Cymbalta 90 mg QD and gabapentin 100 mg TID - Continue home hydroxyurea  # Hepatic masses: - MRI abdomenas an outpatient   Dispo: Anticipated discharge in approximately today (s).   Burna CashSantos-Sanchez, Lajuane Leatham, MD 10/01/2017, 7:10 AM Pager: [phone]

## 2017-10-01 NOTE — Discharge Summary (Signed)
Name: Joanna [name] MRN: [id number] DOB: May 10, 1980 37 y.o. PCP: Patient, No Pcp Per  Date of Admission: 09/28/2017 12:48 AM Date of Discharge: 10/01/2017 Attending Physician: Joanna Contes, MD  Discharge Diagnosis: 1. Sickle cell pain crisis 2. Hepatic masses   Discharge Medications: Allergies as of 10/01/2017      Reactions   Ondansetron Hives, Itching, Rash   Other reaction(s): Itching   Metoclopramide Hives, Rash   Other reaction(s): Itching Other reaction(s): Hives      Medication List    TAKE these medications   ADVAIR DISKUS 250-50 MCG/DOSE Aepb Generic drug:  Fluticasone-Salmeterol Inhale 1 puff into the lungs 2 (two) times daily.   DULoxetine 30 MG capsule Commonly known as:  CYMBALTA Take 30-60 mg by mouth See admin instructions. Take 2 capsules (94m) in th AM and 1 capsule (335m in the evening.   fluticasone 50 MCG/ACT nasal spray Commonly known as:  FLONASE Place 1 spray into the nose daily as needed for allergies.   folic acid 1 MG tablet Commonly known as:  FOLVITE Take 1 mg by mouth daily.   furosemide 20 MG tablet Commonly known as:  LASIX Take 20 mg by mouth daily as needed. For leg swelling   gabapentin 100 MG capsule Commonly known as:  NEURONTIN Take 100 mg by mouth 3 (three) times daily.   hydroxyurea 100 mg/mL Susp Commonly known as:  HYDREA Take 1,000 mg by mouth daily.   methadone 5 MG tablet Commonly known as:  DOLOPHINE Take 5 mg by mouth every 12 (twelve) hours.   NARCAN 4 MG/0.1ML Liqd nasal spray kit Generic drug:  naloxone Place 1 spray into the nose as needed (for overdose).   Oxycodone HCl 10 MG Tabs Take 10 mg by mouth 3 (three) times daily as needed for pain.   PROVENTIL HFA 108 (90 Base) MCG/ACT inhaler Generic drug:  albuterol Inhale 2 puffs into the lungs every 6 (six) hours as needed for wheezing.       Disposition and follow-up:   [name]  At the hospital follow up visit please address:  1.  Please assess for ongoing bilateral upper and lower extremity pain. Please further discuss with patient outpatient MRI abdomen for further evaluation of hepatic masses.   2.  Labs / imaging needed at time of follow-up: CBC to check Hgb   3.  Pending labs/ test needing follow-up: None.   [facility] Go to.   Why:  Please make sure to follow up with your sickle [facility]          Hospital Course by problem list:  1. Sickle cell pain crisis: Patient presented with bilateral upper and lower extremity pain, lower back pain and N/V consistent with previous sickle cell pain crises. She was continued on her home oxycodone and methadone and was provided with IV pain medication for breakthrough pain as well as IV fluids. Her hemoglobin remained stable at 6.4 during this admission. She did not receive a blood transfusion.   2. Liver masses: Patient has a history of a right hepatic lobe mass that was noted on MRI abdomen on 10/2016.  On CT abdomen/pelvis obtained on previous admission (6/13-6/16) she was noted to have two right hepatic lobe masses with non-specific characteristics (see report below). MRI abdomen ordered for further evaluation, but patient declined. She was recommended to get an MRI  abdomen as an outpatient.    Discharge Vitals:   BP 92/63 (BP Location: Left Arm)   Pulse 75   Temp 98.5 F (36.9 C) (Oral)   Resp 18   Ht '4\' 11"'  (1.499 m)   Wt 151 lb 10.8 oz (68.8 kg)   LMP 09/07/2017   SpO2 100%   BMI 30.63 kg/m   Pertinent Labs, Studies, and Procedures:   CBC Latest Ref Rng & Units 10/01/2017 09/30/2017 09/29/2017  WBC 4.0 - 10.5 K/uL 14.8(H) 15.0(H) 10.9(H)  Hemoglobin 12.0 - 15.0 g/dL 6.2(LL) 6.4(LL) 6.4(LL)  Hematocrit 36.0 - 46.0 % 18.5(L) 19.1(L) 19.1(L)  Platelets 150 - 400 K/uL 504(H) 518(H) 504(H)   BMP Latest Ref Rng & Units  09/30/2017 09/29/2017 09/28/2017  Glucose 65 - 99 mg/dL 84 93 99  BUN 6 - 20 mg/dL 7 7 <5(L)  Creatinine 0.44 - 1.00 mg/dL 0.54 0.69 0.63  Sodium 135 - 145 mmol/L 137 136 136  Potassium 3.5 - 5.1 mmol/L 4.4 4.5 3.9  Chloride 101 - 111 mmol/L 107 110 107  CO2 22 - 32 mmol/L '26 22 23  ' Calcium 8.9 - 10.3 mg/dL 8.7(L) 8.4(L) 8.8(L)   LDH 216 Haptoglobin < 10   Discharge Instructions: Discharge Instructions    Call MD for:  persistant nausea and vomiting   Complete by:  As directed    Call MD for:  severe uncontrolled pain   Complete by:  As directed    Diet - low sodium heart healthy   Complete by:  As directed    Discharge instructions   Complete by:  As directed    [name],   You were admitted to the hospital due to a sickle cell pain crisis.  You were treated with fluids and IV pain medication. Your hemoglobin levels were stable during this admission. Please make sure to follow-up with your hematology Dr. Festus [name] tomorrow.  Continue taking your medications as usual. Please call if you have any questions.  - Dr. Frederico [name]   Increase activity slowly   Complete by:  As directed       Signed: Welford Roche, MD 10/04/2017, 3:18 PM   Pager: [phone]

## 2017-10-02 ENCOUNTER — Other Ambulatory Visit (HOSPITAL_COMMUNITY): Payer: Self-pay | Admitting: *Deleted

## 2017-10-19 ENCOUNTER — Inpatient Hospital Stay (HOSPITAL_COMMUNITY)
Admission: EM | Admit: 2017-10-19 | Discharge: 2017-10-21 | DRG: 811 | Disposition: A | Discharge status: Home / Self Care | Payer: Medicaid Other | Attending: Internal Medicine | Admitting: Internal Medicine

## 2017-10-19 ENCOUNTER — Other Ambulatory Visit: Payer: Self-pay

## 2017-10-19 ENCOUNTER — Encounter (HOSPITAL_COMMUNITY): Payer: Self-pay | Admitting: Emergency Medicine

## 2017-10-19 ENCOUNTER — Emergency Department (HOSPITAL_COMMUNITY): Payer: Medicaid Other

## 2017-10-19 DIAGNOSIS — I4581 Long QT syndrome: Secondary | ICD-10-CM | POA: Diagnosis present

## 2017-10-19 DIAGNOSIS — J45909 Unspecified asthma, uncomplicated: Secondary | ICD-10-CM | POA: Diagnosis present

## 2017-10-19 DIAGNOSIS — Z888 Allergy status to other drugs, medicaments and biological substances status: Secondary | ICD-10-CM

## 2017-10-19 DIAGNOSIS — R011 Cardiac murmur, unspecified: Secondary | ICD-10-CM | POA: Diagnosis not present

## 2017-10-19 DIAGNOSIS — F329 Major depressive disorder, single episode, unspecified: Secondary | ICD-10-CM | POA: Diagnosis present

## 2017-10-19 DIAGNOSIS — F339 Major depressive disorder, recurrent, unspecified: Secondary | ICD-10-CM | POA: Diagnosis not present

## 2017-10-19 DIAGNOSIS — Z720 Tobacco use: Secondary | ICD-10-CM | POA: Diagnosis not present

## 2017-10-19 DIAGNOSIS — J189 Pneumonia, unspecified organism: Secondary | ICD-10-CM

## 2017-10-19 DIAGNOSIS — Z7951 Long term (current) use of inhaled steroids: Secondary | ICD-10-CM | POA: Diagnosis not present

## 2017-10-19 DIAGNOSIS — Z79899 Other long term (current) drug therapy: Secondary | ICD-10-CM | POA: Diagnosis not present

## 2017-10-19 DIAGNOSIS — Z833 Family history of diabetes mellitus: Secondary | ICD-10-CM

## 2017-10-19 DIAGNOSIS — D5700 Hb-SS disease with crisis, unspecified: Secondary | ICD-10-CM | POA: Diagnosis present

## 2017-10-19 DIAGNOSIS — R918 Other nonspecific abnormal finding of lung field: Secondary | ICD-10-CM | POA: Diagnosis not present

## 2017-10-19 DIAGNOSIS — J129 Viral pneumonia, unspecified: Secondary | ICD-10-CM | POA: Diagnosis present

## 2017-10-19 DIAGNOSIS — K769 Liver disease, unspecified: Secondary | ICD-10-CM | POA: Diagnosis present

## 2017-10-19 DIAGNOSIS — F17210 Nicotine dependence, cigarettes, uncomplicated: Secondary | ICD-10-CM | POA: Diagnosis present

## 2017-10-19 LAB — TYPE AND SCREEN
ABO/RH(D): B POS
Antibody Screen: NEGATIVE

## 2017-10-19 LAB — CBC WITH DIFFERENTIAL/PLATELET
BASOS ABS: 0.2 10*3/uL — AB (ref 0.0–0.1)
Basophils Relative: 1 %
EOS ABS: 0.5 10*3/uL (ref 0.0–0.7)
Eosinophils Relative: 2 %
HCT: 25.3 % — ABNORMAL LOW (ref 36.0–46.0)
HEMOGLOBIN: 8.4 g/dL — AB (ref 12.0–15.0)
LYMPHS PCT: 27 %
Lymphs Abs: 6.1 10*3/uL — ABNORMAL HIGH (ref 0.7–4.0)
MCH: 33.1 pg (ref 26.0–34.0)
MCHC: 33.2 g/dL (ref 30.0–36.0)
MCV: 99.6 fL (ref 78.0–100.0)
Monocytes Absolute: 1.6 10*3/uL — ABNORMAL HIGH (ref 0.1–1.0)
Monocytes Relative: 7 %
NEUTROS ABS: 14.2 10*3/uL — AB (ref 1.7–7.7)
Neutrophils Relative %: 63 %
PLATELETS: 535 10*3/uL — AB (ref 150–400)
RBC: 2.54 MIL/uL — ABNORMAL LOW (ref 3.87–5.11)
RDW: 26.5 % — AB (ref 11.5–15.5)
WBC: 22.6 10*3/uL — ABNORMAL HIGH (ref 4.0–10.5)

## 2017-10-19 LAB — I-STAT BETA HCG BLOOD, ED (MC, WL, AP ONLY): I-stat hCG, quantitative: <5 m[IU]/mL (ref <5)

## 2017-10-19 LAB — COMPREHENSIVE METABOLIC PANEL
ALBUMIN: 3.9 g/dL (ref 3.5–5.0)
ALT: 18 U/L (ref 0–44)
ANION GAP: 7 (ref 5–15)
AST: 33 U/L (ref 15–41)
Alkaline Phosphatase: 142 U/L — ABNORMAL HIGH (ref 38–126)
BILIRUBIN TOTAL: 2.7 mg/dL — AB (ref 0.3–1.2)
BUN: 5 mg/dL — ABNORMAL LOW (ref 6–20)
CALCIUM: 8.9 mg/dL (ref 8.9–10.3)
CO2: 25 mmol/L (ref 22–32)
Chloride: 105 mmol/L (ref 98–111)
Creatinine, Ser: 0.58 mg/dL (ref 0.44–1.00)
GFR calc non Af Amer: >60 mL/min (ref >60)
GLUCOSE: 94 mg/dL (ref 70–99)
Potassium: 4.2 mmol/L (ref 3.5–5.1)
SODIUM: 137 mmol/L (ref 135–145)
TOTAL PROTEIN: 6.4 g/dL — AB (ref 6.5–8.1)

## 2017-10-19 LAB — RETICULOCYTES
RBC.: 2.54 MIL/uL — AB (ref 3.87–5.11)
Retic Ct Pct: >23 % — ABNORMAL HIGH (ref 0.4–3.1)

## 2017-10-19 LAB — TROPONIN I

## 2017-10-19 LAB — ABO/RH: ABO/RH(D): B POS

## 2017-10-19 MED ORDER — HYDROXYUREA 100 MG/ML ORAL SUSPENSION
1000.0000 mg | Freq: Every day | ORAL | Status: DC
  Administered 2017-10-20 – 2017-10-21 (×2): 1000 mg via ORAL
  Filled 2017-10-19 (×2): qty 10

## 2017-10-19 MED ORDER — SODIUM CHLORIDE 0.9 % IV SOLN
2.0000 g | Freq: Three times a day (TID) | INTRAVENOUS | Status: DC
  Administered 2017-10-19 – 2017-10-20 (×4): 2 g via INTRAVENOUS
  Filled 2017-10-19 (×5): qty 2

## 2017-10-19 MED ORDER — ALBUTEROL SULFATE (2.5 MG/3ML) 0.083% IN NEBU
2.5000 mg | INHALATION_SOLUTION | RESPIRATORY_TRACT | Status: DC | PRN
  Administered 2017-10-19: 2.5 mg via RESPIRATORY_TRACT
  Filled 2017-10-19: qty 3

## 2017-10-19 MED ORDER — GABAPENTIN 100 MG PO CAPS
100.0000 mg | ORAL_CAPSULE | Freq: Three times a day (TID) | ORAL | Status: DC
  Administered 2017-10-19 – 2017-10-20 (×5): 100 mg via ORAL
  Filled 2017-10-19 (×6): qty 1

## 2017-10-19 MED ORDER — IPRATROPIUM-ALBUTEROL 0.5-2.5 (3) MG/3ML IN SOLN
3.0000 mL | Freq: Once | RESPIRATORY_TRACT | Status: AC
  Administered 2017-10-19: 3 mL via RESPIRATORY_TRACT
  Filled 2017-10-19: qty 3

## 2017-10-19 MED ORDER — DULOXETINE HCL 30 MG PO CPEP
30.0000 mg | ORAL_CAPSULE | Freq: Every day | ORAL | Status: DC
  Administered 2017-10-19 – 2017-10-20 (×2): 30 mg via ORAL
  Filled 2017-10-19 (×2): qty 1

## 2017-10-19 MED ORDER — DULOXETINE HCL 30 MG PO CPEP: 30.0000 mg | ORAL_CAPSULE | ORAL | Status: DC

## 2017-10-19 MED ORDER — HYDROMORPHONE HCL 1 MG/ML IJ SOLN
1.0000 mg | INTRAMUSCULAR | Status: DC | PRN
  Administered 2017-10-19 – 2017-10-20 (×5): 1 mg via INTRAVENOUS
  Filled 2017-10-19 (×5): qty 1

## 2017-10-19 MED ORDER — HYDROMORPHONE HCL 1 MG/ML IJ SOLN
1.0000 mg | INTRAMUSCULAR | Status: DC | PRN
  Administered 2017-10-19 (×3): 1 mg via SUBCUTANEOUS
  Filled 2017-10-19 (×3): qty 1

## 2017-10-19 MED ORDER — AZITHROMYCIN 500 MG PO TABS
500.0000 mg | ORAL_TABLET | Freq: Every day | ORAL | Status: DC
  Administered 2017-10-19 – 2017-10-21 (×3): 500 mg via ORAL
  Filled 2017-10-19 (×3): qty 1

## 2017-10-19 MED ORDER — IPRATROPIUM BROMIDE 0.02 % IN SOLN
0.5000 mg | Freq: Four times a day (QID) | RESPIRATORY_TRACT | Status: DC
  Administered 2017-10-19 (×3): 0.5 mg via RESPIRATORY_TRACT
  Filled 2017-10-19 (×4): qty 2.5

## 2017-10-19 MED ORDER — HEPARIN SODIUM (PORCINE) 5000 UNIT/ML IJ SOLN
5000.0000 [IU] | Freq: Three times a day (TID) | INTRAMUSCULAR | Status: DC
  Administered 2017-10-19 – 2017-10-21 (×6): 5000 [IU] via SUBCUTANEOUS
  Filled 2017-10-19 (×6): qty 1

## 2017-10-19 MED ORDER — HYDROMORPHONE HCL 1 MG/ML IJ SOLN: 2.0000 mg | INTRAMUSCULAR | Status: AC

## 2017-10-19 MED ORDER — KETOROLAC TROMETHAMINE 30 MG/ML IJ SOLN
30.0000 mg | INTRAMUSCULAR | Status: AC
  Administered 2017-10-19: 30 mg via INTRAVENOUS
  Filled 2017-10-19: qty 1

## 2017-10-19 MED ORDER — DULOXETINE HCL 60 MG PO CPEP
60.0000 mg | ORAL_CAPSULE | Freq: Every day | ORAL | Status: DC
  Administered 2017-10-20 – 2017-10-21 (×2): 60 mg via ORAL
  Filled 2017-10-19 (×2): qty 1

## 2017-10-19 MED ORDER — HYDROMORPHONE HCL 1 MG/ML IJ SOLN
0.5000 mg | Freq: Once | INTRAMUSCULAR | Status: AC
  Administered 2017-10-19: 0.5 mg via SUBCUTANEOUS
  Filled 2017-10-19: qty 1

## 2017-10-19 MED ORDER — HYDROMORPHONE HCL 1 MG/ML IJ SOLN
2.0000 mg | INTRAMUSCULAR | Status: AC
  Administered 2017-10-19: 2 mg via INTRAVENOUS
  Filled 2017-10-19: qty 2

## 2017-10-19 MED ORDER — HYDROMORPHONE HCL 1 MG/ML IJ SOLN: 2.0000 mg | INTRAMUSCULAR | Status: DC

## 2017-10-19 MED ORDER — DEXTROSE-NACL 5-0.45 % IV SOLN
INTRAVENOUS | Status: DC
  Administered 2017-10-19: 07:00:00 via INTRAVENOUS

## 2017-10-19 MED ORDER — DIPHENHYDRAMINE HCL 25 MG PO CAPS
25.0000 mg | ORAL_CAPSULE | ORAL | Status: DC | PRN
  Filled 2017-10-19: qty 1

## 2017-10-19 MED ORDER — HYDROMORPHONE HCL 1 MG/ML IJ SOLN
2.0000 mg | INTRAMUSCULAR | Status: DC
  Administered 2017-10-19: 2 mg via INTRAVENOUS
  Filled 2017-10-19: qty 2

## 2017-10-19 MED ORDER — IPRATROPIUM-ALBUTEROL 0.5-2.5 (3) MG/3ML IN SOLN
3.0000 mL | Freq: Four times a day (QID) | RESPIRATORY_TRACT | Status: DC
  Administered 2017-10-20: 3 mL via RESPIRATORY_TRACT
  Filled 2017-10-19 (×2): qty 3

## 2017-10-19 MED ORDER — DEXTROSE-NACL 5-0.45 % IV SOLN
INTRAVENOUS | Status: AC
  Administered 2017-10-19 – 2017-10-20 (×3): via INTRAVENOUS

## 2017-10-19 NOTE — ED Provider Notes (Signed)
[facility] EMERGENCY DEPARTMENT Provider Note   CSN: [id number] Arrival date & time: 10/19/17  0401     History   Chief Complaint Chief Complaint  Patient presents with  . Sickle Cell Pain Crisis    HPI Joanna [name] is a 37 y.o. female presenting for evaluation of sickle cell pain.  Patient states that last night she started to develop pain of bilateral upper and lower extremities.  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  She took her at home oxycodone and methadone without improvement of symptoms.  She denies fevers, chills, chest pain, shortness of breath, nausea, vomiting, abdominal pain.  She reports bilateral lower leg swelling for the past several days.  She states she has been outside a lot recently, and thinks she is dehydrated.  She thinks this might have triggered her crisis.  She has a history of asthma for which she uses inhaler as needed.  She has not used it today.  HPI  Past Medical History:  Diagnosis Date  . Asthma   . Sickle cell anemia [facility])     Patient Active Problem List   Diagnosis Date Noted  . Sickle cell crisis (HCC) 09/28/2017  . Sickle cell anemia with pain (HCC) 09/28/2017  . Sickle cell pain crisis (HCC) 09/21/2017  . Abdominal pain 09/21/2017  . Tobacco abuse 09/21/2017    History reviewed. No pertinent surgical history.   OB History   None      Home Medications    Prior to Admission medications   Medication Sig Start Date End Date Taking? Authorizing Provider  albuterol (PROVENTIL HFA) 108 (90 Base) MCG/ACT inhaler Inhale 2 puffs into the lungs every 6 (six) hours as needed for wheezing.  12/20/13   [provider]  DULoxetine (CYMBALTA) 30 MG capsule Take 30-60 mg by mouth See admin instructions. Take 2 capsules (60mg ) in th AM and 1 capsule (30mg ) in the evening. 09/04/17   [provider]  fluticasone (FLONASE) 50 MCG/ACT nasal spray Place 1 spray into the nose daily as needed for allergies.      [provider]  Fluticasone-Salmeterol (ADVAIR DISKUS) 250-50 MCG/DOSE AEPB Inhale 1 puff into the lungs 2 (two) times daily. 08/26/17   [provider]  folic acid (FOLVITE) 1 MG tablet Take 1 mg by mouth daily.    [provider]  furosemide (LASIX) 20 MG tablet Take 20 mg by mouth daily as needed. For leg swelling 08/26/17   [provider]  gabapentin (NEURONTIN) 100 MG capsule Take 100 mg by mouth 3 (three) times daily. 08/17/17   [provider]  hydroxyurea (HYDREA) 100 mg/mL SUSP Take 1,000 mg by mouth daily.    [provider]  methadone (DOLOPHINE) 5 MG tablet Take 5 mg by mouth every 12 (twelve) hours.    [provider]  naloxone [facility]) nasal spray 4 mg/0.1 mL Place 1 spray into the nose as needed (for overdose).  11/21/14   [provider]  Oxycodone HCl 10 MG TABS Take 10 mg by mouth 3 (three) times daily as needed for pain.    [provider]    Family History Family History  Problem Relation Age of Onset  . Diabetes Father     Social History Social History   Tobacco Use  . Smoking status: Current Every Day Smoker    Packs/day: 1.00  . Smokeless tobacco: Current User  Substance Use Topics  . Alcohol use: Not Currently  Comment: "every blue moon"  . Drug use: Not Currently     Allergies   Ondansetron and Metoclopramide   Review of Systems Review of Systems  Musculoskeletal: Positive for myalgias.  All other systems reviewed and are negative.    Physical Exam Updated Vital Signs BP 109/73 (BP Location: Right Arm)   Pulse 79   Temp 98.5 F (36.9 C) (Oral)   Resp 16   Ht 4\' 11"  (1.499 m)   Wt 68 kg (150 lb)   SpO2 94%   BMI 30.30 kg/m   Physical Exam  Constitutional: She is oriented to person, place, and time. She appears well-developed and well-nourished. No distress.  Appears in no distress  HENT:  Head: Normocephalic and atraumatic.  Eyes: Pupils are equal,  round, and reactive to light. Conjunctivae and EOM are normal.  Neck: Normal range of motion. Neck supple.  Cardiovascular: Normal rate, regular rhythm and intact distal pulses.  Pulmonary/Chest: Effort normal and breath sounds normal. No respiratory distress. She has no wheezes.  Scattered expiratory wheezing.  Speaking full sentences.  No respiratory distress.  Abdominal: Soft. She exhibits no distension and no mass. There is no tenderness. There is no guarding.  Musculoskeletal: Normal range of motion. She exhibits edema and tenderness.  Bilateral nonpitting edema with good pedal pulses.  Tenderness to palpation of upper and lower extremities.  No erythema or warmth.  Neurological: She is alert and oriented to person, place, and time. No sensory deficit.  Skin: Skin is warm and dry. Capillary refill takes less than 2 seconds.  Psychiatric: She has a normal mood and affect.  Nursing note and vitals reviewed.    ED Treatments / Results  Labs (all labs ordered are listed, but only abnormal results are displayed) Labs Reviewed  COMPREHENSIVE METABOLIC PANEL - Abnormal; Notable for the following components:      Result Value   BUN 5 (*)    Total Protein 6.4 (*)    Alkaline Phosphatase 142 (*)    Total Bilirubin 2.7 (*)    All other components within normal limits  CBC WITH DIFFERENTIAL/PLATELET - Abnormal; Notable for the following components:   WBC 22.6 (*)    RBC 2.54 (*)    Hemoglobin 8.4 (*)    HCT 25.3 (*)    RDW 26.5 (*)    Platelets 535 (*)    Neutro Abs 14.2 (*)    Lymphs Abs 6.1 (*)    Monocytes Absolute 1.6 (*)    Basophils Absolute 0.2 (*)    All other components within normal limits  RETICULOCYTES - Abnormal; Notable for the following components:   Retic Ct Pct >23.0 (*)    RBC. 2.54 (*)    All other components within normal limits  I-STAT BETA HCG BLOOD, ED ([facility], [facility], [facility] ONLY)    EKG None  Radiology No results found.  Procedures Procedures (including  critical care time)  Medications Ordered in ED Medications  dextrose 5 %-0.45 % sodium chloride infusion ( Intravenous New Bag/Given 10/19/17 16100632)  HYDROmorphone (DILAUDID) injection 2 mg (has no administration in time range)    Or  HYDROmorphone (DILAUDID) injection 2 mg (has no administration in time range)  HYDROmorphone (DILAUDID) injection 2 mg (has no administration in time range)    Or  HYDROmorphone (DILAUDID) injection 2 mg (has no administration in time range)  HYDROmorphone (DILAUDID) injection 2 mg (has no administration in time range)    Or  HYDROmorphone (DILAUDID) injection 2 mg (has no  administration in time range)  diphenhydrAMINE (BENADRYL) capsule 25-50 mg (has no administration in time range)  ipratropium-albuterol (DUONEB) 0.5-2.5 (3) MG/3ML nebulizer solution 3 mL (has no administration in time range)  HYDROmorphone (DILAUDID) injection 0.5 mg (0.5 mg Subcutaneous Given 10/19/17 0432)  ketorolac (TORADOL) 30 MG/ML injection 30 mg (30 mg Intravenous Given 10/19/17 4098)  HYDROmorphone (DILAUDID) injection 2 mg (2 mg Intravenous Given 10/19/17 0634)    Or  HYDROmorphone (DILAUDID) injection 2 mg ( Subcutaneous See Alternative 10/19/17 1191)     Initial Impression / Assessment and Plan / ED Course  I have reviewed the triage vital signs and the nursing notes.  Pertinent labs & imaging results that were available during my care of the patient were reviewed by me and considered in my medical decision making (see chart for details).     Patient presenting for evaluation of sickle cell pain crisis.  Pain is in her arms and legs, which is consistent with her normal crisis.  Not improved with home medication.  No fevers, cough, or chest pain, doubt acute chest.  On exam, wheezing noted, sats in the low 90s.  Will obtain chest x-ray, give DuoNeb, and start sickle cell order set.  On reexamination, lung sounds improved.  Patient reports mild improvement of her pain at 2 doses, will  reevaluate after third dose.  Patient reports pain is still around a 7/10 pain after the third dose.  She is concerned that if she goes home, she will have to return.  She does not feel like her pain is well controlled at this time.  Will call for admission.  Discussed with internal medicine teaching service, patient to be admitted.   Final Clinical Impressions(s) / ED Diagnoses   Final diagnoses:  Sickle cell pain crisis [facility])    ED Discharge Orders    None       Alveria Apley, PA-C 10/19/17 1421    Palumbo, April, MD 10/19/17 2305

## 2017-10-19 NOTE — Progress Notes (Signed)
Patient came onto 7M-14 via stretcher. Alert and oriented x3 to 4. Tele: Box-14 NSR. Pateint belongings transported with patient. Left AC IV infusing. Will continue to monitor.   Lillia PaulsLaura B. RN

## 2017-10-19 NOTE — ED Notes (Addendum)
Pt falling asleep while talking to RN and snoring. Pt still reports ongoing pain. RN notified PA that pt is very lethargic. Verbal order to continue giving pain medication.

## 2017-10-19 NOTE — Progress Notes (Signed)
RT NOTE: RT attempted to get patient to perform peak flow for asthma monitoring. Patient is extremely somnolent and is unable to open eyes for more than a couple of seconds. Per RN and MD patient was given pain medicine in ED and that is why she is so sleepy. RT was able to get patient to perform IS a few times but was unable to get patient to perform peak flow. RT will attempt again later when patient is more alert and able. Vitals are stable. RT will continue to monitor.

## 2017-10-19 NOTE — ED Triage Notes (Signed)
Reports pain in arms and legs since last night.  Regular pain meds not helping.

## 2017-10-19 NOTE — ED Notes (Signed)
Pt continues to report pain when asked.

## 2017-10-19 NOTE — ED Notes (Signed)
Admitting MD at bedside. Pt continues to be very lethargic, snoring, BP low. Pt is arouse able to voice. Verbal order from MD to bolus remaining fluids- .

## 2017-10-19 NOTE — ED Notes (Addendum)
Pt reports a sickle cell crisis that started yesterday and has not gotten any better. Pt reports pain all over. Pt was ambulatory to the room with no obvious pain or injury.

## 2017-10-19 NOTE — H&P (Addendum)
Date: 10/19/2017               Patient Name:  Joanna [name] MRN: [id number]  DOB: 1981/02/16 Age / Sex: 37 y.o., female   PCP: Patient, No Pcp Per         Medical Service: Internal Medicine Teaching Service         Attending Physician: Dr. Doneen PoissonKlima, Lawrence, MD    First Contact: Dr. Maryla MorrowMasoudi  Pager: [phone]  Second Contact: Dr. Nelson ChimesAmin Pager: [phone]       After Hours (After 5p/  First Contact Pager: [phone]  weekends / holidays): Second Contact Pager: [phone]   Chief Complaint: arm and leg pain   History of Present Illness:  Joanna [name] is a 37 year old woman with history of hemoglobin SS disease, tobacco use, and asthma (no prior PFTs available in epic at this time) who presents with bilateral arm and leg pain which began yesterday and is consistent with prior sickle cell pain crisis. Yesterday morning she began having wheezing and difficulty breathing, she also notes that she's been coughing. Further history was obtained from the chart as Joanna [name] is sedated after receiving IV Dilaudid in the ED.   She was recently admit 6/17 - 6/20 after she present with upper and lower extremity pain, lower back pain, and nausea and vomiting consistent with previous sickle cell pain crisis. Her pain was treated with IV dilaudid and her hemoglobin remained stable at 6.4 during the admission. She did not receive red blood cell transfusion.   Meds:  Current Meds  Medication Sig  . albuterol (PROVENTIL HFA) 108 (90 Base) MCG/ACT inhaler Inhale 2 puffs into the lungs every 6 (six) hours as needed for wheezing.   . gabapentin (NEURONTIN) 100 MG capsule Take 100 mg by mouth 3 (three) times [name].  . methadone (DOLOPHINE) 5 MG tablet Take 5 mg by mouth every 12 (twelve) hours.  . Oxycodone HCl 10 MG TABS Take 10 mg by mouth every 8 (eight) hours as needed (pain).      Allergies: Allergies as of 10/19/2017 - Review Complete 10/19/2017  Allergen Reaction Noted  . Ondansetron Hives, Itching, and Rash  09/28/2005  . Metoclopramide Hives and Rash 09/16/2010   Past Medical History:  Diagnosis Date  . Asthma   . Sickle cell anemia (HCC)     Family History: Father - diabetes   Social History:  Current one pack per day smoker.   Review of Systems: A complete ROS was negative except as per HPI.   Physical Exam: Blood pressure 100/62, pulse 71, temperature 98.2 F (36.8 C), temperature source Oral, resp. rate 12, height 4\' 11"  (1.499 m), weight 163 lb 5.8 oz (74.1 kg), SpO2 100 %. General: no acute distress HEENT: icteric sclera, moist mucous membranes  Cardiac: regular rate and rhythm, normal S1 and S2, no murmur appreciated, trace bilateral lower extremity pitting edema   Pulm: end expiratory wheeze, intercostal retraction and abdominal breathing while sleeping, good air movement throughout all lung fields, rhonchi are not appreciated GI: the abdomen is distended, soft, non tender, the spleen is not palpable  Neuro: sleeping through the exam and physical, oriented to self, demonstrates movement of the upper and lower extremities.   EKG: personally reviewed my interpretation is sinus rhythm, heart rate 72, prolonged QTc, T wave inversions in leads III and V3   CXR: personally reviewed my interpretation PA upright chest xray with diffuse patchy infiltrates most notable bibasilar in the right  and left lower lobes, no focal consolidation   Assessment & Plan by Problem: Active Problems:   Sickle cell crisis (HCC)  Sickle cell crisis  Mild - moderate acute chest syndrome  This is a 36 year old woman with history of asthma, sickle cell disease, and tobacco use presents with pain in the upper and lower extremities, shortness of breath, and wheezing concerning for sickle cell pain crises with mild acute chest syndrome. Hemoglobin at the time of admission is consistent with baseline 7-8 and improved from the time of prior hospitalization but she is describing shortness of breath and was  found to have a low oxygen saturation reading 86% while in the ED with chest xray concerning for multifocal infiltrates. This presentation is concerning for mild - moderate acute chest syndrome given low SpO2 with new oxygen requirement, interstitial infiltrates on chest xray which were not mentioned on a chest xray report on care everywhere from 08/2017, shortness of breath, wheezing and increased work of breathing.  - s/p 1 L D5 1/2 NS bolus, start D5 1/2 ns @ 150 cc/hr  - ipratropium q6h scheduled, albuterol q2h PRN  - start cefepime and azithromycin  - if oxygen saturation drops to less than 92%, obtain ABG  - Dilaudid 1 mg q2h with hold parameters, use caution given her current level of sedation however this can be adjusted if it is not helping to control her pain - heparin for VTE prophylaxis  - continue home cymbalta and gabapentin  - incentive spirometry and PEFR monitoring  - follow up reticulocyte count tomorrow morning  - continue home hydroxyurea   Dispo: Admit patient to Inpatient with expected length of stay greater than 2 midnights.  Signed: Eulah Pont, MD 10/19/2017, 12:30 PM  Pager: [phone]

## 2017-10-20 DIAGNOSIS — J45909 Unspecified asthma, uncomplicated: Secondary | ICD-10-CM

## 2017-10-20 DIAGNOSIS — K769 Liver disease, unspecified: Secondary | ICD-10-CM

## 2017-10-20 DIAGNOSIS — Z888 Allergy status to other drugs, medicaments and biological substances status: Secondary | ICD-10-CM

## 2017-10-20 DIAGNOSIS — R011 Cardiac murmur, unspecified: Secondary | ICD-10-CM

## 2017-10-20 DIAGNOSIS — J189 Pneumonia, unspecified organism: Secondary | ICD-10-CM

## 2017-10-20 DIAGNOSIS — F339 Major depressive disorder, recurrent, unspecified: Secondary | ICD-10-CM

## 2017-10-20 DIAGNOSIS — D5700 Hb-SS disease with crisis, unspecified: Principal | ICD-10-CM

## 2017-10-20 DIAGNOSIS — F17210 Nicotine dependence, cigarettes, uncomplicated: Secondary | ICD-10-CM

## 2017-10-20 DIAGNOSIS — Z79899 Other long term (current) drug therapy: Secondary | ICD-10-CM

## 2017-10-20 DIAGNOSIS — R918 Other nonspecific abnormal finding of lung field: Secondary | ICD-10-CM

## 2017-10-20 LAB — CBC
HCT: 20.7 % — ABNORMAL LOW (ref 36.0–46.0)
HEMOGLOBIN: 7 g/dL — AB (ref 12.0–15.0)
MCH: 32.4 pg (ref 26.0–34.0)
MCHC: 33.8 g/dL (ref 30.0–36.0)
MCV: 95.8 fL (ref 78.0–100.0)
PLATELETS: 428 10*3/uL — AB (ref 150–400)
RBC: 2.16 MIL/uL — ABNORMAL LOW (ref 3.87–5.11)
RDW: 23.9 % — AB (ref 11.5–15.5)
WBC: 12 10*3/uL — ABNORMAL HIGH (ref 4.0–10.5)

## 2017-10-20 LAB — RETICULOCYTES
RBC.: 2.16 MIL/uL — ABNORMAL LOW (ref 3.87–5.11)
Retic Ct Pct: >23 % — ABNORMAL HIGH (ref 0.4–3.1)

## 2017-10-20 LAB — TROPONIN I: Troponin I: <0.03 ng/mL (ref <0.03)

## 2017-10-20 MED ORDER — HYDROMORPHONE HCL 1 MG/ML IJ SOLN: 2.0000 mg | INTRAMUSCULAR | Status: DC | PRN

## 2017-10-20 MED ORDER — DEXTROSE-NACL 5-0.45 % IV SOLN
INTRAVENOUS | Status: DC
Start: 2017-10-20 — End: 2017-10-21
  Administered 2017-10-20 (×2): via INTRAVENOUS

## 2017-10-20 MED ORDER — HYDROMORPHONE HCL 1 MG/ML IJ SOLN
2.0000 mg | INTRAMUSCULAR | Status: DC | PRN
  Administered 2017-10-20 – 2017-10-21 (×11): 2 mg via INTRAVENOUS
  Filled 2017-10-20 (×10): qty 2

## 2017-10-20 NOTE — Progress Notes (Signed)
Patient B/P was 104/70 patient is schedule PRN Dilaudid 2mg  pain was 8/10 MD notify of B/P and permission was given to administer medication.

## 2017-10-20 NOTE — Progress Notes (Signed)
   Subjective: Pt is much more alert this morning and is sitting up in bed and able to talk with us. She endorses pain in bilateral upper and lower extremities as well as lower back, which she endorses is consistent with her normal pain crisis. She denies SOB currently, but endorses SOB on exertion 1 week prior to admission. Denies CP, denies cough. Pt endorses she has had acute chest syndrome "years ago" and is confident she does not have acute chest syndrome d/t her lack of chest pain. She has started eating, but is not yet at her normal po intake. She requests increase in Dilaudid dose to 2mg  q2h, which she states is her usual pain regimen for pain crisis.  Objective:  Vital signs in last 24 hours: Vitals:   10/20/17 0223 10/20/17 0445 10/20/17 0845 10/20/17 1146  BP:  102/63 (!) 100/49 104/70  Pulse:  72 76 84  Resp:  17    Temp:  98.1 F (36.7 C) 98.5 F (36.9 C)   TempSrc:  Oral Oral   SpO2: 96% 100% 95% 91%  Weight:      Height:       Physical Exam: Gen: pt sitting up in bed, NAD Cardio: RRR, no murmurs, 2+ radial pulses bilaterally Resp: LCAB, no increased WOB Abdominal: soft, non-tender, non-distended Neuro: A&Ox3, normal speech  Assessment/Plan: Ms. Annice PihDail is a 37 yo female with hx of sickle cell SS disease, tobacco use, asthma (no prior pFTs in chart), and MDD who presents with bilateral upper and lower extremities as well as lower back, which she endorses is consistent with her normal pain crisis. Additionally she endorsed 1 week hx of dyspnea on exertion and CXR showed multifocal infiltrates, raising concern for acute chest syndrome.  #sickle cell acute pain crisis -Hgb 7 down from 8.4 on arrival most likely d/t hemodilution -reticulocyte production appropriately elevated in setting of RBC destruction, no concern for aplastic crisis at this time Plan: -increase Dilaudid to 2mg  q2h PRN pain, pt reports this is her typical pain regimen -po Benadryl PRN itching -continue  home hydroxyurea -monitor for hypotension d/t baseline soft pressures -po hydration as tolerated  #viral PNA -CXR 7/8 with bilateral multifocal infiltrates concerning for viral PNA vs acute chest syndrome  -ACS unlikely d/t pts description that is not consistent with prior ACS episodes, however this is something we do not want to miss -weaned off O2 Plan: -treat empirically for acute chest with cefepime and azithromycin -pain and hydration as above  #asthma -possible culprit for 1 week hx of exertional SOB Plan: -DuoNebs q6h when pt is awake -albuterol PRN  #liver masses Abdominal CT 6/10 showed 2 masses in the R lobe of the liver significant 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  Perle Gibbon, Nira RetortSamantha A, Medical Student 10/20/2017, 2:26 PM  Pager [phone]

## 2017-10-21 ENCOUNTER — Inpatient Hospital Stay (HOSPITAL_COMMUNITY): Payer: Medicaid Other

## 2017-10-21 DIAGNOSIS — Z720 Tobacco use: Secondary | ICD-10-CM

## 2017-10-21 LAB — COMPREHENSIVE METABOLIC PANEL WITH GFR
ALT: 16 U/L (ref 0–44)
AST: 26 U/L (ref 15–41)
Albumin: 3.9 g/dL (ref 3.5–5.0)
Alkaline Phosphatase: 129 U/L — ABNORMAL HIGH (ref 38–126)
Anion gap: 9 (ref 5–15)
BUN: 6 mg/dL (ref 6–20)
CO2: 25 mmol/L (ref 22–32)
Calcium: 8.9 mg/dL (ref 8.9–10.3)
Chloride: 102 mmol/L (ref 98–111)
Creatinine, Ser: 0.55 mg/dL (ref 0.44–1.00)
GFR calc Af Amer: >60 mL/min
GFR calc non Af Amer: >60 mL/min
Glucose, Bld: 92 mg/dL (ref 70–99)
Potassium: 4.5 mmol/L (ref 3.5–5.1)
Sodium: 136 mmol/L (ref 135–145)
Total Bilirubin: 4.1 mg/dL — ABNORMAL HIGH (ref 0.3–1.2)
Total Protein: 6.6 g/dL (ref 6.5–8.1)

## 2017-10-21 LAB — CBC
HEMATOCRIT: 19.9 % — AB (ref 36.0–46.0)
Hemoglobin: 6.9 g/dL — CL (ref 12.0–15.0)
MCH: 32.9 pg (ref 26.0–34.0)
MCHC: 34.7 g/dL (ref 30.0–36.0)
MCV: 94.8 fL (ref 78.0–100.0)
PLATELETS: 449 10*3/uL — AB (ref 150–400)
RBC: 2.1 MIL/uL — ABNORMAL LOW (ref 3.87–5.11)
RDW: 23.2 % — AB (ref 11.5–15.5)
WBC: 13.7 10*3/uL — AB (ref 4.0–10.5)

## 2017-10-21 LAB — RETICULOCYTES: RBC.: 2.1 MIL/uL — ABNORMAL LOW (ref 3.87–5.11)

## 2017-10-21 MED ORDER — AZITHROMYCIN 500 MG PO TABS: 500.0000 mg | ORAL_TABLET | Freq: Every day | ORAL | 0 refills | Status: AC

## 2017-10-21 NOTE — Progress Notes (Signed)
Internal Medicine returned page.  Pt has sickle cell, no new orders at this time.

## 2017-10-21 NOTE — Progress Notes (Signed)
Internal Medicine Attending  Date: 10/21/2017  Patient name: Joanna [name] Medical record number: [id number] Date of birth: [birth year] Age: 37 y.o. Gender: female  I saw and evaluated the patient. I reviewed the sub-intern/resident's note by Ms. Coffer and Dr. Nelson ChimesAmin and I agree with the sub-intern/resident's findings and plans as documented in their progress note.  When seen on rounds this morning Ms. Rossitto felt improved and well enough to be discharged home on her home oral pain regimen. We will complete the 5 day course of antibiotics for the presumed community-acquired pneumonia.

## 2017-10-21 NOTE — Progress Notes (Signed)
Critial Hemoglobin 6.9, paged Internal Medicine.

## 2017-10-21 NOTE — Progress Notes (Signed)
Patient discharged to home, reviewed the AVS and answered all her questions, Discontinued IV before discharge.  Patient escorted out of the unit in w/c and took all her belongings with her.

## 2017-10-21 NOTE — Progress Notes (Signed)
Pt came to dest stated was leaving AMA due to [place] in [place] by 1430.  Paged Internal Medicine for discharge paperwork.

## 2017-10-21 NOTE — Progress Notes (Signed)
   Subjective: Pt reports her pain is still present, but tolerable today. She believes she can manage her pain at home. Her legs are most bothersome. Denies CP, SOB, cough. Pt is tolerating po, but is not eating as much as she usually does. Pt attempted MRI this morning but states she became claustrophobic and could not complete the test.  Objective:  Vital signs in last 24 hours: Vitals:   10/20/17 1704 10/20/17 2122 10/21/17 0501 10/21/17 1004  BP: 110/68 112/73 109/72 112/77  Pulse: 78 76 71 80  Resp: 18 18 19 14   Temp: 98.4 F (36.9 C) 98 F (36.7 C) 98.1 F (36.7 C) 98 F (36.7 C)  TempSrc: Oral Oral  Oral  SpO2: 96% 91% 100% 98%  Weight:  74.1 kg (163 lb 5.9 oz)    Height:       Gen: NAD, sitting up in bed Cardio: RRR, S1, S2, no murmur, 2+ radial pulses bilaterally Resp: no increased WOB, LCAB Abdominal: soft, non-tender, +BS Neuro: Joanna [name]&O x 3, normal speech   Assessment/Plan: Ms. Joanna Joanna [name] is Joanna [name] 37 yo female with hx of sickle cell SS disease, tobacco use, asthma (no prior pFTs in chart), and MDD who presents with bilateral upper and lower extremities as well as lower back, which she endorses is consistent with her normal pain crisis. Additionally she endorsed 1 week hx of dyspnea on exertion and CXR showed multifocal infiltrates, initially raising concern for acute chest syndrome, but now thought to be atypical PNA.  #sickle cell acute pain crisis -Hgb 6.9 down from 8.4 on arrival d/t hemodilution -reticulocyte production appropriately elevated in setting of RBC destruction, no concern for aplastic crisis at this time Plan: -Dilaudid 2mg  q2h PRN pain, pt reports this is her typical pain regimen -po Benadryl PRN itching -continue home hydroxyurea -monitor for hypotension d/t baseline soft pressures -D5 1/2NS 150/hr  #atypical PNA -CXR 7/8 with bilateral multifocal infiltrates concerning for atypical  PNA vs acute chest syndrome  -ACS unlikely d/t pts description that is  not consistent with prior ACS episodes, no O2 requirement, and stable Hgb Plan: -stop cefepime -continue azithromycin, send pt home with 2 more doses for total course of 5 days for atypical PNA  #asthma -no PFTs on file Plan: -albuterol PRN  #Liver masses Abdominal CT 6/10 showed 2 masses in the R lobe of the liver significant for focal nodular hyperplasia vs adenomas -pt did not tolerate MRI d/t claustrophobia Plan:  -FU outpatient with US guided bx vs surveillance in 3 mo with triple phase CT abdomen  #MDD Plan: -continue home Cymbalta  #DVT Ppx -heparin  #Dispo: Inpatient admission for sickle cell pain crisis, expected LOS <24 hrs.    Joanna Joanna [name], Joanna Joanna [name], Medical Student 10/21/2017, 3:34 PM Pager [phone]

## 2017-10-21 NOTE — Discharge Summary (Addendum)
Name: Joanna [name] MRN: [id number] DOB: July 06, 1980 37 y.o. PCP: Patient, No Pcp Per  Date of Admission: 10/19/2017  4:26 AM Date of Discharge: 10/21/2017 Attending Physician: No att. providers found  Discharge Diagnosis: 1. Sickle Cell Acute Pain Crisis 2. Atypical Community Acquired Pneumonia 3. Hepatic Lesions 4. Asthma  Discharge Medications: Allergies as of 10/21/2017      Reactions   Ondansetron Hives, Itching, Rash      Metoclopramide Hives, Itching, Rash      Medication List    TAKE these medications   azithromycin 500 MG tablet Commonly known as:  ZITHROMAX Take 1 tablet (500 mg total) by mouth daily for 2 days.   DULoxetine 30 MG capsule Commonly known as:  CYMBALTA Take 30-60 mg by mouth See admin instructions. Take 2 capsules (688m) in the morning and 1 capsule (361m in the evening.   fluticasone 50 MCG/ACT nasal spray Commonly known as:  FLONASE Place 1 spray into the nose daily as needed for allergies.   folic acid 1 MG tablet Commonly known as:  FOLVITE Take 1 mg by mouth daily.   gabapentin 100 MG capsule Commonly known as:  NEURONTIN Take 100 mg by mouth 3 (three) times daily.   hydroxyurea 100 mg/mL Susp Commonly known as:  HYDREA Take 1,000 mg by mouth daily at 6 (six) AM.   methadone 5 MG tablet Commonly known as:  DOLOPHINE Take 5 mg by mouth every 12 (twelve) hours.   NARCAN 4 MG/0.1ML Liqd nasal spray kit Generic drug:  naloxone Place 1 spray into the nose once as needed (opiod overdose).   Oxycodone HCl 10 MG Tabs Take 10 mg by mouth every 4 (four) hours as needed (pain).   PROVENTIL HFA 108 (90 Base) MCG/ACT inhaler Generic drug:  albuterol Inhale 2 puffs into the lungs every 6 (six) hours as needed for wheezing.   risperiDONE 2 MG tablet Commonly known as:  RISPERDAL Take 2 mg by mouth 2 (two) times daily.       Disposition and follow-up:   [name]   At the hospital follow up visit please address:  1.  Please FU pts continuing work up of hepatic lesions, see detailed course below. Please encourage compliance with finishing abx regimen for atypical PNA.  2.  Labs / imaging needed at time of follow-up: N/A  3.  Pending labs/ test needing follow-up: N/A  Follow-up Appointments: [facility] [place] [facility] so that we could facilitate that appointment.  Hospital Course by problem list: 1. Sickle Cell Acute Pain Crisis Pt presented to the ED on 7/8 with pain in her bilateral UE, bilateral LE, and low back consistent with her typical acute pain crisis. Pt reported hx of 1 week of dyspnea on exertion, but denied chest pain. CXR revealed bilateral multifocal infiltrates initially concerning for acute chest syndrome therefore pt was started on cefepime and azithromycin in the ED. Pt had minimal O2 requirements, which quickly resolved. Pts pain was treated with [id number] Dilaudid q2h, which is her typical inpatient pain regimen for crises and with IV fluids. Pt continued to deny chest pain, was breathing well on room air, with stable Hgb 7-8, therefore we felt that bilateral infiltrates on CXR did not represent acute chest syndrome, but rather an atypical or viral community acquired pneumonia which triggered the pts present acute  pain crisis. Cefepime was discontinued, and pt was sent home with 2 days of azithromycin to complete a total of 5 day course.  2. Atypical Community Acquired Pneumonia Bilateral multifocal infiltrates along with 1 week history of dyspnea on exertion, most likely represent atypical or viral community acquired pneumonia, which triggered acute pain crisis. Pt treated with abx as above, and will be sent home with 2 days of azithromycin to complete a total of 5 day course.   3. Hepatic Lesions At previous hospitalization, 2 right hepatic lobe lesions were found  incidentally on abdominal CT 09/21/17. Pt had not yet followed up for more imaging, so in order to expedite the process, we attempted MRI during this hospitalization. Pt became claustrophobic during the MRI, which prevented any imaging. Radiology recommends follow up with US guided biopsy or surveillance with Triple Phase Abdominal CT in 3 months, September 2019. Please follow up with patient regarding these options in the outpatient setting.  4. Asthma Pt reports history of asthma, no PFTs available in EMR to confirm. Pt treated with DuoNebs and Albuterol as needed. She had minimal O2 requirements upon arrival which quickly resolved. Pt uses tobacco chronically.   Discharge Vitals:   BP 112/77 (BP Location: Left Arm)   Pulse 80   Temp 98 F (36.7 C) (Oral)   Resp 14   Ht '4\' 11"'  (1.499 m)   Wt 74.1 kg (163 lb 5.9 oz)   SpO2 98%   BMI 33.00 kg/m   Pertinent Labs, Studies, and Procedures:  Labs Hgb 8.4 --> 7 --> 6.9 Retic >23% throughout stay WBC 22.6 --> 13.7  CXR 10/21/17: multifocal bilateral infiltrates  CT Abdomen 09/21/17: At least 2 low-attenuation masses in right hepatic lobe which have nonspecific characteristics. These may represent focal nodular hyperplasia or adenomas given patient's age and sex.   Discharge Instructions: Discharge Instructions    Call MD for:  difficulty breathing, headache or visual disturbances   Complete by:  As directed    Call MD for:  severe uncontrolled pain   Complete by:  As directed    Diet - low sodium heart healthy   Complete by:  As directed    Discharge instructions   Complete by:  As directed    Please take the antibiotic we are sending you, azithromycin, for 2 more days. Please follow up with your sickle cell doctor at [facility].   Increase activity slowly   Complete by:  As directed       Signed: Coffer, Earnest Conroy, Medical Student 10/21/2017, 2:36 PM   Pager: [phone]  Attestation for Student Documentation:  I personally was  present and performed or re-performed the history, physical exam and medical decision-making activities of this service and have verified that the service and findings are accurately documented in the student's note.  Lorella Nimrod, MD 10/21/2017, 7:11 PM

## 2017-11-19 ENCOUNTER — Emergency Department (HOSPITAL_COMMUNITY)
Admission: EM | Admit: 2017-11-19 | Discharge: 2017-11-19 | Disposition: A | Discharge status: Home / Self Care | Payer: Medicaid Other | Attending: Emergency Medicine | Admitting: Emergency Medicine

## 2017-11-19 ENCOUNTER — Encounter (HOSPITAL_COMMUNITY): Payer: Self-pay | Admitting: Emergency Medicine

## 2017-11-19 ENCOUNTER — Emergency Department (HOSPITAL_COMMUNITY): Payer: Medicaid Other

## 2017-11-19 ENCOUNTER — Other Ambulatory Visit: Payer: Self-pay

## 2017-11-19 DIAGNOSIS — M79601 Pain in right arm: Secondary | ICD-10-CM | POA: Diagnosis present

## 2017-11-19 DIAGNOSIS — F17200 Nicotine dependence, unspecified, uncomplicated: Secondary | ICD-10-CM | POA: Insufficient documentation

## 2017-11-19 DIAGNOSIS — D5700 Hb-SS disease with crisis, unspecified: Secondary | ICD-10-CM

## 2017-11-19 DIAGNOSIS — Z79899 Other long term (current) drug therapy: Secondary | ICD-10-CM | POA: Diagnosis not present

## 2017-11-19 DIAGNOSIS — J45909 Unspecified asthma, uncomplicated: Secondary | ICD-10-CM | POA: Diagnosis not present

## 2017-11-19 LAB — COMPREHENSIVE METABOLIC PANEL
ALBUMIN: 3.9 g/dL (ref 3.5–5.0)
ALK PHOS: 129 U/L — AB (ref 38–126)
ALT: 13 U/L (ref 0–44)
ANION GAP: 10 (ref 5–15)
AST: 31 U/L (ref 15–41)
BILIRUBIN TOTAL: 2.5 mg/dL — AB (ref 0.3–1.2)
BUN: 5 mg/dL — AB (ref 6–20)
CALCIUM: 8.8 mg/dL — AB (ref 8.9–10.3)
CO2: 23 mmol/L (ref 22–32)
Chloride: 102 mmol/L (ref 98–111)
Creatinine, Ser: 0.58 mg/dL (ref 0.44–1.00)
GFR calc Af Amer: >60 mL/min (ref >60)
GLUCOSE: 82 mg/dL (ref 70–99)
POTASSIUM: 4.3 mmol/L (ref 3.5–5.1)
Sodium: 135 mmol/L (ref 135–145)
TOTAL PROTEIN: 6.4 g/dL — AB (ref 6.5–8.1)

## 2017-11-19 LAB — CBC WITH DIFFERENTIAL/PLATELET
BASOS ABS: 0.2 10*3/uL — AB (ref 0.0–0.1)
Basophils Relative: 1 %
Eosinophils Absolute: 0.7 10*3/uL (ref 0.0–0.7)
Eosinophils Relative: 4 %
HCT: 24.7 % — ABNORMAL LOW (ref 36.0–46.0)
Hemoglobin: 8.7 g/dL — ABNORMAL LOW (ref 12.0–15.0)
LYMPHS ABS: 4.8 10*3/uL — AB (ref 0.7–4.0)
Lymphocytes Relative: 29 %
MCH: 35.7 pg — ABNORMAL HIGH (ref 26.0–34.0)
MCHC: 35.2 g/dL (ref 30.0–36.0)
MCV: 101.2 fL — ABNORMAL HIGH (ref 78.0–100.0)
MONOS PCT: 7 %
Monocytes Absolute: 1.2 10*3/uL — ABNORMAL HIGH (ref 0.1–1.0)
NEUTROS ABS: 9.8 10*3/uL — AB (ref 1.7–7.7)
Neutrophils Relative %: 59 %
PLATELETS: 363 10*3/uL (ref 150–400)
RBC: 2.44 MIL/uL — AB (ref 3.87–5.11)
RDW: 23.6 % — AB (ref 11.5–15.5)
WBC: 16.7 10*3/uL — AB (ref 4.0–10.5)

## 2017-11-19 LAB — I-STAT BETA HCG BLOOD, ED (MC, WL, AP ONLY): I-stat hCG, quantitative: <5 m[IU]/mL (ref <5)

## 2017-11-19 LAB — RETICULOCYTES
RBC.: 2.45 MIL/uL — AB (ref 3.87–5.11)
Retic Ct Pct: >23 % — ABNORMAL HIGH (ref 0.4–3.1)

## 2017-11-19 MED ORDER — HYDROMORPHONE HCL 1 MG/ML IJ SOLN
2.0000 mg | INTRAMUSCULAR | Status: AC
  Administered 2017-11-19: 2 mg via INTRAVENOUS
  Filled 2017-11-19: qty 2

## 2017-11-19 MED ORDER — PROMETHAZINE HCL 25 MG/ML IJ SOLN
25.0000 mg | Freq: Once | INTRAMUSCULAR | Status: AC
  Administered 2017-11-19: 25 mg via INTRAMUSCULAR

## 2017-11-19 MED ORDER — HYDROMORPHONE HCL 1 MG/ML IJ SOLN
2.0000 mg | Freq: Once | INTRAMUSCULAR | Status: AC
  Administered 2017-11-19: 2 mg via INTRAMUSCULAR

## 2017-11-19 MED ORDER — HYDROMORPHONE HCL 1 MG/ML IJ SOLN: 2.0000 mg | INTRAMUSCULAR | Status: AC

## 2017-11-19 MED ORDER — DEXTROSE-NACL 5-0.45 % IV SOLN
INTRAVENOUS | Status: DC
  Administered 2017-11-19: 07:00:00 via INTRAVENOUS

## 2017-11-19 MED ORDER — KETOROLAC TROMETHAMINE 30 MG/ML IJ SOLN
30.0000 mg | INTRAMUSCULAR | Status: AC
  Administered 2017-11-19: 30 mg via INTRAVENOUS
  Filled 2017-11-19: qty 1

## 2017-11-19 MED ORDER — PROMETHAZINE HCL 25 MG/ML IJ SOLN
12.5000 mg | Freq: Once | INTRAMUSCULAR | Status: DC
  Filled 2017-11-19: qty 1

## 2017-11-19 MED ORDER — HYDROMORPHONE HCL 1 MG/ML IJ SOLN
0.5000 mg | Freq: Once | INTRAMUSCULAR | Status: AC
  Administered 2017-11-19: 0.5 mg via SUBCUTANEOUS
  Filled 2017-11-19: qty 1

## 2017-11-19 MED ORDER — HYDROMORPHONE HCL 1 MG/ML IJ SOLN
2.0000 mg | Freq: Once | INTRAMUSCULAR | Status: DC
  Filled 2017-11-19: qty 2

## 2017-11-19 NOTE — ED Notes (Signed)
Pt stable, ambulatory, states understanding of discharge instructions 

## 2017-11-19 NOTE — ED Notes (Signed)
Pt not in room when RN went to discharge pt and go over paperwork

## 2017-11-19 NOTE — ED Provider Notes (Signed)
[facility] EMERGENCY DEPARTMENT Provider Note   CSN: [id number] Arrival date & time: 11/19/17  0413     History   Chief Complaint Chief Complaint  Patient presents with  . Sickle Cell Pain Crisis    HPI Joanna [name] is a 37 y.o. female.  37 y/o female with hx of sickle cell anemia presents to the ED for c/o sickle cell crisis. Pain is constant and present in arms and legs. Reports associated BLE swelling which is not uncommon for her sickle cell crisis. No new or worsening chest pain, SOB, fevers, N/V. Has been using her home methadone and 10mg  oxycodone without relief. Is followed by hematology at [facility].  The history is provided by the patient. No language interpreter was used.  Sickle Cell Pain Crisis    Past Medical History:  Diagnosis Date  . Asthma   . Sickle cell anemia [facility])     Patient Active Problem List   Diagnosis Date Noted  . Community acquired pneumonia   . Sickle cell crisis (HCC) 09/28/2017  . Sickle cell anemia with pain (HCC) 09/28/2017  . Sickle cell pain crisis (HCC) 09/21/2017  . Abdominal pain 09/21/2017  . Tobacco abuse 09/21/2017    History reviewed. No pertinent surgical history.   OB History    Gravida  1   Para      Term      Preterm      AB      Living        SAB      TAB      Ectopic      Multiple      Live Births               Home Medications    Prior to Admission medications   Medication Sig Start Date End Date Taking? Authorizing Provider  albuterol (PROVENTIL HFA) 108 (90 Base) MCG/ACT inhaler Inhale 2 puffs into the lungs every 6 (six) hours as needed for wheezing.  12/20/13  Yes [provider]  DULoxetine (CYMBALTA) 30 MG capsule Take 30-60 mg by mouth See admin instructions. Take 2 capsules (60mg ) in the morning and 1 capsule (30mg ) in the evening. 09/04/17  Yes [provider]  fluticasone (FLONASE) 50 MCG/ACT nasal spray Place 1 spray into the nose daily as  needed for allergies.    Yes [provider]  folic acid (FOLVITE) 1 MG tablet Take 1 mg by mouth daily.   Yes [provider]  gabapentin (NEURONTIN) 100 MG capsule Take 100 mg by mouth 3 (three) times daily. 08/17/17  Yes [provider]  hydroxyurea (HYDREA) 100 mg/mL SUSP Take 1,000 mg by mouth daily.    Yes [provider]  methadone (DOLOPHINE) 5 MG tablet Take 5 mg by mouth every 12 (twelve) hours.   Yes [provider]  naloxone (NARCAN) nasal spray 4 mg/0.1 mL Place 1 spray into the nose once as needed (opiod overdose).  11/21/14  Yes [provider]  Oxycodone HCl 10 MG TABS Take 10 mg by mouth every 4 (four) hours as needed (pain).    Yes [provider]  risperiDONE (RISPERDAL) 2 MG tablet Take 2 mg by mouth 2 (two) times daily. 10/03/17  Yes [provider]    Family History Family History  Problem Relation Age of Onset  . Diabetes Father     Social History Social History   Tobacco Use  . Smoking status: Current Every Day  Smoker    Packs/day: 1.00  . Smokeless tobacco: Current User  Substance Use Topics  . Alcohol use: Not Currently    Comment: "every blue moon"  . Drug use: Not Currently     Allergies   Ondansetron and Metoclopramide   Review of Systems Review of Systems Ten systems reviewed and are negative for acute change, except as noted in the HPI.    Physical Exam Updated Vital Signs Ht 4\' 11"  (1.499 m)   Wt 68 kg   LMP 11/15/2017 (Exact Date)   BMI 30.30 kg/m   Physical Exam  Constitutional: She is oriented to person, place, and time. She appears well-developed and well-nourished. No distress.  Nontoxic appearing and in NAD  HENT:  Head: Normocephalic and atraumatic.  Eyes: Conjunctivae and EOM are normal. No scleral icterus.  Neck: Normal range of motion.  Cardiovascular: Normal rate, regular rhythm and intact distal pulses.  Pulmonary/Chest: Effort normal. No stridor. No  respiratory distress.  Respirations even and unlabored  Musculoskeletal: Normal range of motion.  Neurological: She is alert and oriented to person, place, and time. She exhibits normal muscle tone. Coordination normal.  GCS 15. Moving all extremities.  Skin: Skin is warm and dry. No rash noted. She is not diaphoretic. No erythema. No pallor.  Psychiatric: She has a normal mood and affect. Her behavior is normal.  Nursing note and vitals reviewed.    ED Treatments / Results  Labs (all labs ordered are listed, but only abnormal results are displayed) Labs Reviewed  COMPREHENSIVE METABOLIC PANEL  CBC WITH DIFFERENTIAL/PLATELET  RETICULOCYTES  I-STAT BETA HCG BLOOD, ED ([facility], [facility], [facility] ONLY)    EKG None  Radiology No results found.  Procedures Procedures (including critical care time)  Medications Ordered in ED Medications  ketorolac (TORADOL) 30 MG/ML injection 30 mg (has no administration in time range)  dextrose 5 %-0.45 % sodium chloride infusion (has no administration in time range)  promethazine (PHENERGAN) injection 25 mg (has no administration in time range)  HYDROmorphone (DILAUDID) injection 2 mg (has no administration in time range)  HYDROmorphone (DILAUDID) injection 0.5 mg (0.5 mg Subcutaneous Given 11/19/17 0425)     Initial Impression / Assessment and Plan / ED Course  I have reviewed the triage vital signs and the nursing notes.  Pertinent labs & imaging results that were available during my care of the patient were reviewed by me and considered in my medical decision making (see chart for details).     37 year old female presents to the emergency department for pain consistent with sickle cell crisis.  No new or worsening chest pain or shortness of breath.  Patient given initial dose of Dilaudid and Phenergan as IM injections as currently unable to obtain IV access.  Pending laboratory work-up.  Patient signed out to AM ED resident provider.   Final Clinical  Impressions(s) / ED Diagnoses   Final diagnoses:  Sickle cell pain crisis [facility](HCC)    ED Discharge Orders    None       Antony MaduraHumes, Fahd Galea, PA-C 11/19/17 [id number]    Gilda CreasePollina, Christopher J, MD 11/19/17 2326

## 2017-11-19 NOTE — ED Triage Notes (Signed)
Pt reports she is having a sickle cell crisis that began last night in her legs and arms. Pt denies any other pain.

## 2017-11-19 NOTE — ED Notes (Signed)
Attempted IV access x2, without success.  

## 2017-11-19 NOTE — Discharge Instructions (Addendum)
Follow-up with your primary care doctor and/or hematologist in 3 days for reevaluation.  Return to the emergency department if symptoms worsen, chest pain, shortness of breath, fevers, or other concerning symptoms.  Continue home medications as previously prescribed.  Recommend increased water/fluid intake and avoiding dehydration.

## 2017-11-19 NOTE — ED Notes (Signed)
IV Team at bedside 

## 2017-11-19 NOTE — ED Provider Notes (Signed)
Care assumed from Joanna [name], [place]. arm and leg pain. Swelling in BLE's. All typical for prior [place] pain crises. Is on methadone and oxycodone tablets at home, not helping at this time. Received first dose of dilaudid and IM phenergan. She often gets admitted. [facility] Awaiting IV access and lab results.    Physical Exam  BP 111/77 (BP Location: Right Arm)   Pulse 71   Temp 98.1 F (36.7 C) (Oral)   Resp 15   Ht 4\' 11"  (1.499 m)   Wt 68 kg   LMP 11/15/2017 (Exact Date)   SpO2 98%   BMI 30.30 kg/m   Physical Exam  Constitutional: No distress.  Cardiovascular: Normal rate and regular rhythm. Exam reveals no friction rub.  No murmur heard. Pulmonary/Chest: No stridor. No respiratory distress. She has no wheezes. She has no rales.  Abdominal: There is no tenderness. There is no guarding.  Musculoskeletal: She exhibits edema (1+ Symmetric BLEs).  Skin: Capillary refill takes less than 2 seconds. She is not diaphoretic.  Psychiatric: She has a normal mood and affect.    ED Course/Procedures     Procedures  MDM    Patient reevaluated and feeling some better.  Reports pain improved to 6/10.  Thinks that she will likely be able to be discharged home given improvement in pain.  Has home pain medications.  Labs reviewed with CMP is largely unremarkable.  Does have T bili of 2.5 down from 4.1 previously.  Stable renal function.  No significant transaminitis.  CBC also with improved hemoglobin now 8.7 up from 6.9 in July.  WBC of 16.7.  Has chronic leukocytosis with similar values in the past.  Denies any infectious symptoms at this time other than cough.  No fever. Retic count similar to patients baseline. No CP/SOA at this time to suggest PE.   Patient does have history of acute chest syndrome in the past.  States that this does not feel similar.  No chest pain or shortness of breath.  Has been having cough.  Chest x-ray obtained that shows no acute cardiac or pulmonary abnormality.  No  consolidations or findings suggest acute chest syndrome  Interpreted as "changes of mild CHF" by radiology. Given stable on RA, no CP/SOA, afebrile, labs similar to improved from baseline, and pain improved pt stable for DC with outpt PCP/hematology follow up. Pt comfortable with plan. Stable at discharge.  Case and plan of care discussed with Dr. Madilyn Hookees.    Rigoberto Noelickens, Alicia Seib, MD 11/19/17 1200    Tilden Fossaees, Elizabeth, MD 11/20/17 [phone]

## 2017-11-20 ENCOUNTER — Inpatient Hospital Stay (HOSPITAL_COMMUNITY)
Admission: EM | Admit: 2017-11-20 | Discharge: 2017-11-23 | DRG: 812 | Disposition: A | Discharge status: Home / Self Care | Payer: Medicaid Other | Attending: Oncology | Admitting: Oncology

## 2017-11-20 ENCOUNTER — Other Ambulatory Visit: Payer: Self-pay

## 2017-11-20 ENCOUNTER — Encounter (HOSPITAL_COMMUNITY): Payer: Self-pay | Admitting: Emergency Medicine

## 2017-11-20 DIAGNOSIS — D5700 Hb-SS disease with crisis, unspecified: Secondary | ICD-10-CM | POA: Diagnosis not present

## 2017-11-20 DIAGNOSIS — J45909 Unspecified asthma, uncomplicated: Secondary | ICD-10-CM | POA: Diagnosis not present

## 2017-11-20 DIAGNOSIS — E860 Dehydration: Secondary | ICD-10-CM | POA: Diagnosis present

## 2017-11-20 DIAGNOSIS — F17210 Nicotine dependence, cigarettes, uncomplicated: Secondary | ICD-10-CM | POA: Diagnosis present

## 2017-11-20 DIAGNOSIS — Z720 Tobacco use: Secondary | ICD-10-CM

## 2017-11-20 DIAGNOSIS — D72829 Elevated white blood cell count, unspecified: Secondary | ICD-10-CM

## 2017-11-20 DIAGNOSIS — Z79891 Long term (current) use of opiate analgesic: Secondary | ICD-10-CM

## 2017-11-20 DIAGNOSIS — K769 Liver disease, unspecified: Secondary | ICD-10-CM | POA: Diagnosis present

## 2017-11-20 DIAGNOSIS — Z9114 Patient's other noncompliance with medication regimen: Secondary | ICD-10-CM

## 2017-11-20 DIAGNOSIS — Z79899 Other long term (current) drug therapy: Secondary | ICD-10-CM

## 2017-11-20 DIAGNOSIS — Z888 Allergy status to other drugs, medicaments and biological substances status: Secondary | ICD-10-CM

## 2017-11-20 DIAGNOSIS — Z7951 Long term (current) use of inhaled steroids: Secondary | ICD-10-CM

## 2017-11-20 LAB — CBC WITH DIFFERENTIAL/PLATELET
BASOS PCT: 1 %
Basophils Absolute: 0.2 10*3/uL — ABNORMAL HIGH (ref 0.0–0.1)
EOS PCT: 4 %
Eosinophils Absolute: 0.7 10*3/uL (ref 0.0–0.7)
HCT: 25.1 % — ABNORMAL LOW (ref 36.0–46.0)
HEMOGLOBIN: 8.4 g/dL — AB (ref 12.0–15.0)
LYMPHS PCT: 25 %
Lymphs Abs: 4.3 10*3/uL — ABNORMAL HIGH (ref 0.7–4.0)
MCH: 33.9 pg (ref 26.0–34.0)
MCHC: 33.5 g/dL (ref 30.0–36.0)
MCV: 101.2 fL — AB (ref 78.0–100.0)
MONO ABS: 1 10*3/uL (ref 0.1–1.0)
Monocytes Relative: 6 %
NEUTROS PCT: 64 %
Neutro Abs: 10.8 10*3/uL — ABNORMAL HIGH (ref 1.7–7.7)
Platelets: 389 10*3/uL (ref 150–400)
RBC: 2.48 MIL/uL — ABNORMAL LOW (ref 3.87–5.11)
RDW: 22.1 % — ABNORMAL HIGH (ref 11.5–15.5)
WBC: 17 10*3/uL — ABNORMAL HIGH (ref 4.0–10.5)

## 2017-11-20 LAB — COMPREHENSIVE METABOLIC PANEL
ALBUMIN: 3.8 g/dL (ref 3.5–5.0)
ALT: 15 U/L (ref 0–44)
AST: 27 U/L (ref 15–41)
Alkaline Phosphatase: 122 U/L (ref 38–126)
Anion gap: 11 (ref 5–15)
BUN: <5 mg/dL — ABNORMAL LOW (ref 6–20)
CHLORIDE: 105 mmol/L (ref 98–111)
CO2: 23 mmol/L (ref 22–32)
CREATININE: 0.64 mg/dL (ref 0.44–1.00)
Calcium: 8.8 mg/dL — ABNORMAL LOW (ref 8.9–10.3)
GFR calc non Af Amer: >60 mL/min (ref >60)
GLUCOSE: 91 mg/dL (ref 70–99)
Potassium: 4.1 mmol/L (ref 3.5–5.1)
SODIUM: 139 mmol/L (ref 135–145)
Total Bilirubin: 2.1 mg/dL — ABNORMAL HIGH (ref 0.3–1.2)
Total Protein: 6.4 g/dL — ABNORMAL LOW (ref 6.5–8.1)

## 2017-11-20 LAB — RETICULOCYTES
RBC.: 2.48 MIL/uL — ABNORMAL LOW (ref 3.87–5.11)
Retic Ct Pct: >23 % — ABNORMAL HIGH (ref 0.4–3.1)

## 2017-11-20 LAB — URINALYSIS, ROUTINE W REFLEX MICROSCOPIC
Bilirubin Urine: NEGATIVE
Glucose, UA: NEGATIVE mg/dL
Ketones, ur: NEGATIVE mg/dL
Leukocytes, UA: NEGATIVE
Nitrite: NEGATIVE
PH: 7 (ref 5.0–8.0)
PROTEIN: 30 mg/dL — AB
SPECIFIC GRAVITY, URINE: 1.009 (ref 1.005–1.030)

## 2017-11-20 LAB — POC URINE PREG, ED: Preg Test, Ur: NEGATIVE

## 2017-11-20 MED ORDER — ALBUTEROL SULFATE (2.5 MG/3ML) 0.083% IN NEBU: 2.5000 mg | INHALATION_SOLUTION | RESPIRATORY_TRACT | Status: DC | PRN

## 2017-11-20 MED ORDER — KETOROLAC TROMETHAMINE 30 MG/ML IJ SOLN
30.0000 mg | INTRAMUSCULAR | Status: AC
  Administered 2017-11-20: 30 mg via INTRAVENOUS
  Filled 2017-11-20: qty 1

## 2017-11-20 MED ORDER — HYDROMORPHONE HCL 1 MG/ML IJ SOLN
2.0000 mg | INTRAMUSCULAR | Status: AC
  Filled 2017-11-20: qty 2

## 2017-11-20 MED ORDER — DIPHENHYDRAMINE HCL 50 MG/ML IJ SOLN
25.0000 mg | Freq: Once | INTRAMUSCULAR | Status: AC
  Administered 2017-11-20: 25 mg via INTRAVENOUS
  Filled 2017-11-20: qty 1

## 2017-11-20 MED ORDER — HYDROXYUREA 100 MG/ML ORAL SUSPENSION
1000.0000 mg | Freq: Every day | ORAL | Status: DC
  Filled 2017-11-20 (×4): qty 10

## 2017-11-20 MED ORDER — DULOXETINE HCL 60 MG PO CPEP
60.0000 mg | ORAL_CAPSULE | Freq: Every day | ORAL | Status: DC
  Administered 2017-11-20 – 2017-11-23 (×4): 60 mg via ORAL
  Filled 2017-11-20 (×4): qty 1

## 2017-11-20 MED ORDER — FOLIC ACID 1 MG PO TABS
1.0000 mg | ORAL_TABLET | Freq: Two times a day (BID) | ORAL | Status: DC
  Administered 2017-11-21 – 2017-11-23 (×5): 1 mg via ORAL
  Filled 2017-11-20 (×5): qty 1

## 2017-11-20 MED ORDER — HYDROMORPHONE HCL 1 MG/ML IJ SOLN: 2.0000 mg | INTRAMUSCULAR | Status: DC

## 2017-11-20 MED ORDER — PROMETHAZINE HCL 25 MG/ML IJ SOLN
12.5000 mg | Freq: Once | INTRAMUSCULAR | Status: AC
  Administered 2017-11-20: 12.5 mg via INTRAVENOUS
  Filled 2017-11-20: qty 1

## 2017-11-20 MED ORDER — HEPARIN SODIUM (PORCINE) 5000 UNIT/ML IJ SOLN
5000.0000 [IU] | Freq: Three times a day (TID) | INTRAMUSCULAR | Status: DC
  Administered 2017-11-20 – 2017-11-23 (×8): 5000 [IU] via SUBCUTANEOUS
  Filled 2017-11-20 (×9): qty 1

## 2017-11-20 MED ORDER — HYDROMORPHONE HCL 1 MG/ML IJ SOLN: 2.0000 mg | INTRAMUSCULAR | Status: AC

## 2017-11-20 MED ORDER — HYDROMORPHONE HCL 1 MG/ML IJ SOLN
2.0000 mg | INTRAMUSCULAR | Status: AC
  Administered 2017-11-20: 2 mg via INTRAVENOUS
  Filled 2017-11-20: qty 2

## 2017-11-20 MED ORDER — HYDROMORPHONE HCL 1 MG/ML IJ SOLN
2.0000 mg | Freq: Once | INTRAMUSCULAR | Status: AC
  Administered 2017-11-20: 2 mg via INTRAVENOUS
  Filled 2017-11-20: qty 2

## 2017-11-20 MED ORDER — HYDROMORPHONE HCL 1 MG/ML IJ SOLN
2.0000 mg | INTRAMUSCULAR | Status: AC
  Administered 2017-11-20: 2 mg via INTRAMUSCULAR

## 2017-11-20 MED ORDER — DIPHENHYDRAMINE HCL 50 MG/ML IJ SOLN
25.0000 mg | INTRAMUSCULAR | Status: DC | PRN
  Administered 2017-11-20: 25 mg via INTRAMUSCULAR

## 2017-11-20 MED ORDER — DULOXETINE HCL 30 MG PO CPEP
30.0000 mg | ORAL_CAPSULE | Freq: Every day | ORAL | Status: DC
  Administered 2017-11-20 – 2017-11-22 (×3): 30 mg via ORAL
  Filled 2017-11-20 (×3): qty 1

## 2017-11-20 MED ORDER — HYDROXYUREA 100 MG/ML ORAL SUSPENSION
1000.0000 mg | Freq: Every day | ORAL | Status: DC
  Filled 2017-11-20: qty 10

## 2017-11-20 MED ORDER — SODIUM CHLORIDE 0.45 % IV SOLN
INTRAVENOUS | Status: DC
  Administered 2017-11-20: 07:00:00 via INTRAVENOUS

## 2017-11-20 MED ORDER — DIPHENHYDRAMINE HCL 50 MG/ML IJ SOLN
25.0000 mg | INTRAMUSCULAR | Status: DC | PRN
  Filled 2017-11-20: qty 1

## 2017-11-20 MED ORDER — FOLIC ACID 1 MG PO TABS
1.0000 mg | ORAL_TABLET | Freq: Every day | ORAL | Status: DC
  Administered 2017-11-20: 1 mg via ORAL
  Filled 2017-11-20: qty 1

## 2017-11-20 MED ORDER — GABAPENTIN 100 MG PO CAPS
100.0000 mg | ORAL_CAPSULE | Freq: Three times a day (TID) | ORAL | Status: DC
  Administered 2017-11-20 – 2017-11-23 (×9): 100 mg via ORAL
  Filled 2017-11-20 (×9): qty 1

## 2017-11-20 MED ORDER — HYDROMORPHONE HCL 1 MG/ML IJ SOLN
2.0000 mg | INTRAMUSCULAR | Status: DC | PRN
  Administered 2017-11-20 – 2017-11-21 (×8): 2 mg via INTRAVENOUS
  Administered 2017-11-21: 1 mg via INTRAVENOUS
  Administered 2017-11-21 – 2017-11-22 (×10): 2 mg via INTRAVENOUS
  Filled 2017-11-20 (×19): qty 2

## 2017-11-20 MED ORDER — DEXTROSE-NACL 5-0.45 % IV SOLN
INTRAVENOUS | Status: DC
  Administered 2017-11-20: 1 mL via INTRAVENOUS
  Administered 2017-11-21 – 2017-11-23 (×9): via INTRAVENOUS

## 2017-11-20 NOTE — ED Notes (Signed)
IV team at bedside 

## 2017-11-20 NOTE — H&P (Signed)
Date: 11/20/2017               Patient Name:  Joanna [name] MRN: [id number]  DOB: 1980/09/19 Age / Sex: 37 y.o., female   PCP: Patient, No Pcp Per         Medical Service: Internal Medicine Teaching Service         Attending Physician: Dr. Raeford Razor, MD    First Contact: Dr. Gwyneth Revels Pager: [phone]  Second Contact: Dr. Obie Dredge Pager: [phone]       After Hours (After 5p/  First Contact Pager: [phone]  weekends / holidays): Second Contact Pager: [phone]   Chief Complaint: Legs, arms, and back pain  History of Present Illness: This is a 37 year old female with a PMH of sickle cell disease on hydroxyurea(multiple admissions for sickle cell crisis), asthma, and tobacco abuse who presented with back, arms, and legs pain. Reports that it has been going on for 2 days.  She reports the pain as sharp, constant in nature, is a 7/10 at the moment, and that it feels similar to her previous sickle cell crisis that she has had. Her home medications of methadone and oxycodone did not help, moving makes it worse. She denies any chest pain, SOB, fevers, chills, vomiting, diarrhea or constipation. Reports a decreased appetite due to the pain with some nausea. She reports that she has not been drinking a lot of water, and that she has been moving things around in her house.   Per chart review patient was admitted in 10/19/17 for progressive SOB and bilateral arm, leg and back pain which was consistent with previous sickle cell crisis however she did not respond to fluids and dilaudid. She had a CXR that showed bilateral multifocal infiltrates that was concerning for acute chest syndrome, however O2 requirements resolved quickly and her SOB improved and it was thought that this was likely due to a viral or atypical CAP, sent home with azithromycin x5 days. She also had hepatic lesions so an MRI was ordered on the previous admission however she did not follow this up, she was unable to complete the MRI on that  admission. Previously admitted on 6/17-6/20 and on 6/10-6/13 for a sickle cell pain crisis.    In the ED she was found to be afebrile, HR 84, RR 15, BP 96/64. Labs were significant for BUN <5,  Calcium 8.8, protein 6.4. CBC showed WBC 17, RBC 2.38, Hgb 8.4, MCV 101.2, RDW 22.1, PLT 389. Reticulocyte was >23. U/A was negative for infection. She was given toradol, dilaudid, benadryl, and phenergan. She was placed on 2L [place]. Difficulty getting an IV so medications have been given IM. Patient was admitted to internal medicine.   Meds:  Scheduled Meds: . DULoxetine  60 mg Oral Daily   And  . DULoxetine  30 mg Oral QHS  . folic acid  1 mg Oral Daily  . gabapentin  100 mg Oral TID  . heparin  5,000 Units Subcutaneous Q8H  . hydroxyurea  1,000 mg Oral Daily   Continuous Infusions: . dextrose 5 % and 0.45% NaCl     PRN Meds:.albuterol, HYDROmorphone (DILAUDID) injection  Current Meds  Medication Sig  . albuterol (PROVENTIL HFA) 108 (90 Base) MCG/ACT inhaler Inhale 2 puffs into the lungs every 6 (six) hours as needed for wheezing.   . DULoxetine (CYMBALTA) 30 MG capsule Take 30-60 mg by mouth See admin instructions. Take 2 capsules (60mg ) in the morning and 1 capsule (30mg )  in the evening.  . fluticasone (FLONASE) 50 MCG/ACT nasal spray Place 1 spray into the nose daily as needed for allergies.   . folic acid (FOLVITE) 1 MG tablet Take 1 mg by mouth daily.  Marland Kitchen. gabapentin (NEURONTIN) 100 MG capsule Take 100 mg by mouth 3 (three) times daily.  . hydroxyurea (HYDREA) 100 mg/mL SUSP Take 1,000 mg by mouth daily.   . methadone (DOLOPHINE) 5 MG tablet Take 5 mg by mouth every 12 (twelve) hours.  . naloxone (NARCAN) nasal spray 4 mg/0.1 mL Place 1 spray into the nose once as needed (opiod overdose).   . Oxycodone HCl 10 MG TABS Take 10 mg by mouth every 4 (four) hours as needed (pain).   . risperiDONE (RISPERDAL) 2 MG tablet Take 2 mg by mouth 2 (two) times daily.     Allergies: Allergies as of  11/20/2017 - Review Complete 11/20/2017  Allergen Reaction Noted  . Ondansetron Hives, Itching, and Rash 09/28/2005  . Metoclopramide Hives, Itching, and Rash 09/16/2010   Past Medical History:  Diagnosis Date  . Asthma   . Sickle cell anemia (HCC)     Family History: Cancer in aunts and uncles, unable to state what type. No history of sickle cell.  Social History: Occasional drinking, smokes 1 cigarette  a day, denies drug use. Lives with brother sometimes, lives with mom the other times.   Review of Systems: A complete ROS was negative except as per HPI.   Physical Exam: Blood pressure 117/87, pulse 69, temperature 98.6 F (37 C), temperature source Oral, resp. rate 12, height 4\' 11"  (1.499 m), weight 68 kg, last menstrual period 11/15/2017, SpO2 97 %. General:Tired appearing, cooperative, appears stated age and no distress HEENT: PERRLA, icteric sclera Heart: S1, S2 normal, no murmur, rub or gallop, regular rate and rhythm Lungs: clear to auscultation, no wheezes or rales and unlabored breathing Abdomen: abdomen is soft without significant tenderness, masses, organomegaly or guarding Extremities: extremities normal, atraumatic, no cyanosis or edema and no edema, redness or tenderness in the calves or thighs Musculoskeletal: tender to palpation in both legs and arms Skin:no rashes, no wounds Neurology: normal without focal findings, mental status, speech normal, alert and oriented x3, PERLA and reflexes normal and symmetric Psychiatry: Normal mood and affect  CXR: personally reviewed my interpretation is AP portable, good inspiration, rotated, some cardiomegaly, increased vasculature congestion bilaterally  Assessment & Plan by Problem: This is a 37 year old female with a PMH of sickle cell disease on hydroxyurea(multiple admissions for sickle cell crisis), asthma, and tobacco abuse who presented with back, arms, and legs pain. Admitted for a sickle cell crisis.   Sickle cell  crisis: Patient presented with typical arms, legs, and back pain consistent with sickle cell crisis attack.  Pain has been uncontrolled after Dilaudid in the ED.  She takes hydroxyurea, methadone, and oxycodone at home.  She reports that this has helped.  She does report moving and some dehydration recently which may have exacerbation the crisis.  She denies any chest pain or shortness of breath.  On exam she was tender to palpation in her arms and legs.  Urinalysis was negative.  CBC showed white blood cell count 17, RBC 2.48, hemoglobin 8.4, MCV 101.2, and differential showed sickle cell present, and reticulocytes >23. She is on 1 L nasal cannula.  Given her history of sickle cell crisis and similarities to previous this is likely another attack.  May have been related to dehydration and increased activity. -Oxygen  as needed -D5 1/5 saline -Hydroxyurea -IV Dilaudid 2 mg every 2 hours -Continue home gabapentin and Cymbalta -CBC in a.m. -Incentive spirometry -Orthostatic vitals  Asthma: Patient has been using her inhalers at home.  No shortness of breath on admission. -Albuterol as needed -Supplemental oxygen as needed  Hepatic lesions: This was noted on previous admissions.  She was unable to obtain an MRI due to claustrophobia on last admission.  We will treat sickle cell crisis and consider obtaining imaging studies this admission.    FEN: D5 1/2 NS 150cc/hr, replete lytes prn, Cu[place] diet  VTE ppx: Heparin Code Status: FULL    Dispo: Admit patient to Observation with expected length of stay less than 2 midnights.  Signed: Claudean Severance, MD 11/20/2017, 2:14 PM  Pager: [phone]

## 2017-11-20 NOTE — ED Notes (Signed)
Unsuccessful attempt to draw blood

## 2017-11-20 NOTE — ED Triage Notes (Signed)
Pt reports that she has been experiencing sickle cell crisis for three days, pain is bilateral leg and arm pain.

## 2017-11-20 NOTE — ED Provider Notes (Signed)
[facility] EMERGENCY DEPARTMENT Provider Note   CSN: [id number] Arrival date & time: 11/20/17  0510     History   Chief Complaint Chief Complaint  Patient presents with  . Sickle Cell Pain Crisis    HPI Joanna [name] is a 37 y.o. female.  The history is provided by the patient.  She has a history of sickle cell disease and asthma and comes in because of ongoing pain from her sickle cell disease.  She has been having pain in her back and legs for the last 3 days.  She had been seen in the emergency department yesterday and was feeling somewhat better upon discharge, but pain got worse during the night and was not responding to her home medications of methadone and oxycodone.  She denies fever, chills, sweats.  She denies cough.  There is been no vomiting or diarrhea.  Pain is currently rated at 9/10.  Past Medical History:  Diagnosis Date  . Asthma   . Sickle cell anemia [facility])     Patient Active Problem List   Diagnosis Date Noted  . Community acquired pneumonia   . Sickle cell crisis (HCC) 09/28/2017  . Sickle cell anemia with pain (HCC) 09/28/2017  . Sickle cell pain crisis (HCC) 09/21/2017  . Abdominal pain 09/21/2017  . Tobacco abuse 09/21/2017    No past surgical history on file.   OB History    Gravida  1   Para      Term      Preterm      AB      Living        SAB      TAB      Ectopic      Multiple      Live Births               Home Medications    Prior to Admission medications   Medication Sig Start Date End Date Taking? Authorizing Provider  albuterol (PROVENTIL HFA) 108 (90 Base) MCG/ACT inhaler Inhale 2 puffs into the lungs every 6 (six) hours as needed for wheezing.  12/20/13   [provider]  DULoxetine (CYMBALTA) 30 MG capsule Take 30-60 mg by mouth See admin instructions. Take 2 capsules (60mg ) in the morning and 1 capsule (30mg ) in the evening. 09/04/17   [provider]  fluticasone (FLONASE)  50 MCG/ACT nasal spray Place 1 spray into the nose daily as needed for allergies.     [provider]  folic acid (FOLVITE) 1 MG tablet Take 1 mg by mouth daily.    [provider]  gabapentin (NEURONTIN) 100 MG capsule Take 100 mg by mouth 3 (three) times daily. 08/17/17   [provider]  hydroxyurea (HYDREA) 100 mg/mL SUSP Take 1,000 mg by mouth daily.     [provider]  methadone (DOLOPHINE) 5 MG tablet Take 5 mg by mouth every 12 (twelve) hours.    [provider]  naloxone [facility]) nasal spray 4 mg/0.1 mL Place 1 spray into the nose once as needed (opiod overdose).  11/21/14   [provider]  Oxycodone HCl 10 MG TABS Take 10 mg by mouth every 4 (four) hours as needed (pain).     [provider]  risperiDONE (RISPERDAL) 2 MG tablet Take 2 mg by mouth 2 (two) times daily. 10/03/17   [provider]    Family History Family History  Problem Relation Age of Onset  . Diabetes  Father     Social History Social History   Tobacco Use  . Smoking status: Current Every Day Smoker    Packs/day: 1.00  . Smokeless tobacco: Current User  Substance Use Topics  . Alcohol use: Not Currently    Comment: "every blue moon"  . Drug use: Not Currently     Allergies   Ondansetron and Metoclopramide   Review of Systems Review of Systems  All other systems reviewed and are negative.    Physical Exam Updated Vital Signs BP 126/88 (BP Location: Right Arm)   Pulse 88   Temp 98.6 F (37 C) (Oral)   Resp 20   Ht 4\' 11"  (1.499 m)   Wt 68 kg   LMP 11/15/2017 (Exact Date)   SpO2 94%   BMI 30.30 kg/m   Physical Exam  Nursing note and vitals reviewed.  37 year old female, resting comfortably and in no acute distress. Vital signs are normal. Oxygen saturation is 94%, which is normal. Head is normocephalic and atraumatic. PERRLA, EOMI. Oropharynx is clear. Neck is nontender and supple without adenopathy or JVD. Back is  nontender and there is no CVA tenderness. Lungs are clear without rales, wheezes, or rhonchi. Chest is nontender. Heart has regular rate and rhythm without murmur. Abdomen is soft, flat, nontender without masses or hepatosplenomegaly and peristalsis is normoactive. Extremities have no cyanosis or edema, full range of motion is present. Skin is warm and dry without rash. Neurologic: Mental status is normal, cranial nerves are intact, there are no motor or sensory deficits.  ED Treatments / Results  Labs (all labs ordered are listed, but only abnormal results are displayed) Labs Reviewed  URINALYSIS, ROUTINE W REFLEX MICROSCOPIC - Abnormal; Notable for the following components:      Result Value   Hgb urine dipstick SMALL (*)    Protein, ur 30 (*)    Bacteria, UA RARE (*)    All other components within normal limits  COMPREHENSIVE METABOLIC PANEL  CBC WITH DIFFERENTIAL/PLATELET  RETICULOCYTES  POC URINE PREG, ED  I-STAT BETA HCG BLOOD, ED ([facility], [facility], [facility] ONLY)   Procedures Procedures   Medications Ordered in ED Medications  0.45 % sodium chloride infusion (has no administration in time range)  ketorolac (TORADOL) 30 MG/ML injection 30 mg (has no administration in time range)  HYDROmorphone (DILAUDID) injection 2 mg (has no administration in time range)    Or  HYDROmorphone (DILAUDID) injection 2 mg (has no administration in time range)  HYDROmorphone (DILAUDID) injection 2 mg (has no administration in time range)    Or  HYDROmorphone (DILAUDID) injection 2 mg (has no administration in time range)  HYDROmorphone (DILAUDID) injection 2 mg (has no administration in time range)    Or  HYDROmorphone (DILAUDID) injection 2 mg (has no administration in time range)  HYDROmorphone (DILAUDID) injection 2 mg (has no administration in time range)    Or  HYDROmorphone (DILAUDID) injection 2 mg (has no administration in time range)  diphenhydrAMINE (BENADRYL) injection 25 mg (has no  administration in time range)  promethazine (PHENERGAN) injection 12.5 mg (has no administration in time range)     Initial Impression / Assessment and Plan / ED Course  I have reviewed the triage vital signs and the nursing notes.  Pertinent lab results that were available during my care of the patient were reviewed by me and considered in my medical decision making (see chart for details).  Sickle cell painful crisis.  Old records are reviewed, and  she is noted to have numerous ED visits and hospitalizations related to her sickle cell disease.  She is started on sickle cell crisis pain protocol.   There was difficulty establishing IV access, so she is getting her medications intramuscularly.  Urinalysis is unremarkable, other labs pending.  Case is signed out to Dr. Juleen ChinaKohut.  Final Clinical Impressions(s) / ED Diagnoses   Final diagnoses:  Sickle cell pain crisis [facility](HCC)    ED Discharge Orders    None       Dione BoozeGlick, Alvina Strother, MD 11/20/17 [phone]

## 2017-11-20 NOTE — Progress Notes (Signed)
Pt new admit from ED for Sickle cell crisis, alert and oriented with left hand peripheral IV, complained of leg pain will give dilaudid PRN.

## 2017-11-20 NOTE — Progress Notes (Signed)
Pt for hydroxyurea 100 mg suspension, she said she can bring her medicine from home, pharmacy aware because she prefer liquid vs capsule.

## 2017-11-20 NOTE — ED Notes (Signed)
Pt placed on 2L [place]

## 2017-11-20 NOTE — ED Notes (Signed)
IV team unsuccesful. Reita ClicheBobby, RN attempting EDP notified.

## 2017-11-21 DIAGNOSIS — I517 Cardiomegaly: Secondary | ICD-10-CM | POA: Diagnosis not present

## 2017-11-21 DIAGNOSIS — E860 Dehydration: Secondary | ICD-10-CM | POA: Diagnosis present

## 2017-11-21 DIAGNOSIS — J45909 Unspecified asthma, uncomplicated: Secondary | ICD-10-CM | POA: Diagnosis present

## 2017-11-21 DIAGNOSIS — Z9114 Patient's other noncompliance with medication regimen: Secondary | ICD-10-CM | POA: Diagnosis not present

## 2017-11-21 DIAGNOSIS — Z720 Tobacco use: Secondary | ICD-10-CM | POA: Diagnosis not present

## 2017-11-21 DIAGNOSIS — D5700 Hb-SS disease with crisis, unspecified: Secondary | ICD-10-CM | POA: Diagnosis present

## 2017-11-21 DIAGNOSIS — Z7951 Long term (current) use of inhaled steroids: Secondary | ICD-10-CM | POA: Diagnosis not present

## 2017-11-21 DIAGNOSIS — Z79899 Other long term (current) drug therapy: Secondary | ICD-10-CM | POA: Diagnosis not present

## 2017-11-21 DIAGNOSIS — D72829 Elevated white blood cell count, unspecified: Secondary | ICD-10-CM | POA: Diagnosis not present

## 2017-11-21 DIAGNOSIS — Z79891 Long term (current) use of opiate analgesic: Secondary | ICD-10-CM | POA: Diagnosis not present

## 2017-11-21 DIAGNOSIS — F17210 Nicotine dependence, cigarettes, uncomplicated: Secondary | ICD-10-CM | POA: Diagnosis present

## 2017-11-21 DIAGNOSIS — K769 Liver disease, unspecified: Secondary | ICD-10-CM | POA: Diagnosis present

## 2017-11-21 LAB — CBC
HCT: 21.8 % — ABNORMAL LOW (ref 36.0–46.0)
HEMOGLOBIN: 7.8 g/dL — AB (ref 12.0–15.0)
MCH: 33.6 pg (ref 26.0–34.0)
MCHC: 35.8 g/dL (ref 30.0–36.0)
MCV: 94 fL (ref 78.0–100.0)
PLATELETS: 384 10*3/uL (ref 150–400)
RBC: 2.32 MIL/uL — AB (ref 3.87–5.11)
RDW: 21.9 % — ABNORMAL HIGH (ref 11.5–15.5)
WBC: 15.6 10*3/uL — AB (ref 4.0–10.5)

## 2017-11-21 LAB — LACTATE DEHYDROGENASE: LDH: 282 U/L — ABNORMAL HIGH (ref 98–192)

## 2017-11-21 NOTE — Progress Notes (Addendum)
   Subjective: Joanna [name] is still having leg pain today. She denies chest pain or difficulty breathing.   Objective:  Vital signs in last 24 hours: Vitals:   11/20/17 2144 11/21/17 0642 11/21/17 0656 11/21/17 0658  BP: 97/68 (!) 97/59    Pulse: 73 75    Resp:  17    Temp: 98.5 F (36.9 C) 98.3 F (36.8 C)    TempSrc: Oral Oral    SpO2: (!) 86% (!) 83% (!) 89% 93%  Weight:      Height:       General: well appearing, no acute distress Cardiac: regular rate and rhythm, no murmurs    Pulm: no supplemental oxygen requirement at this time verified by pulse ox although difficult with fake nails in place, no increased work of breathing, lungs clear to auscultation   GI: abdomen is soft, non tender, non distended   Assessment/Plan:  This is a 37 year old female with a PMH of sickle cell disease on hydroxyurea(multiple admissions for sickle cell crisis), asthma, and tobacco abuse who presented with back, arms, and legs pain typical of her typical pain crisis and was admitted for an uncomplicated sickle cell crisis.     Sickle cell crisis (HCC) - pain is controlled with dilaudid 2 mg q2h  - declining hospital hydroxyurea formulation - No oxygen requirement, chest pain or shortness of breath. No concern for complicated sickle cell crisis at this time.  - continue incentive spirometry  - Continue D51/2 NS at 150 cc/hr  - LDH and retic count consistent with baseline - continue folic acid supplementation   Asthma:  -home Albuterol as needed  Dispo: Anticipated discharge in approximately 1-2 day(s).   Eulah PontBlum, Malu Pellegrini, MD 11/21/2017, 12:01 PM Pager: [phone]

## 2017-11-21 NOTE — Progress Notes (Signed)
Per NT, pt refused to have her Orthostatic Vital signs taken  at this time. Also refused to take Hydroxyurea suspension again, reminded her to ask a family member bring her own med from home.

## 2017-11-22 ENCOUNTER — Encounter (HOSPITAL_COMMUNITY): Payer: Self-pay | Admitting: *Deleted

## 2017-11-22 ENCOUNTER — Other Ambulatory Visit: Payer: Self-pay

## 2017-11-22 DIAGNOSIS — Z9114 Patient's other noncompliance with medication regimen: Secondary | ICD-10-CM

## 2017-11-22 LAB — CBC WITH DIFFERENTIAL/PLATELET
BASOS ABS: 0 10*3/uL (ref 0.0–0.1)
Band Neutrophils: 1 %
Basophils Relative: 0 %
Eosinophils Absolute: 0.7 10*3/uL (ref 0.0–0.7)
Eosinophils Relative: 4 %
HEMATOCRIT: 17.2 % — AB (ref 36.0–46.0)
HEMOGLOBIN: 6.2 g/dL — AB (ref 12.0–15.0)
Lymphocytes Relative: 31 %
Lymphs Abs: 5.2 10*3/uL — ABNORMAL HIGH (ref 0.7–4.0)
MCH: 33.7 pg (ref 26.0–34.0)
MCHC: 36 g/dL (ref 30.0–36.0)
MCV: 93.5 fL (ref 78.0–100.0)
MONOS PCT: 9 %
Monocytes Absolute: 1.5 10*3/uL — ABNORMAL HIGH (ref 0.1–1.0)
NEUTROS ABS: 9.5 10*3/uL — AB (ref 1.7–7.7)
Neutrophils Relative %: 55 %
Platelets: 321 10*3/uL (ref 150–400)
RBC: 1.84 MIL/uL — AB (ref 3.87–5.11)
RDW: 23.2 % — AB (ref 11.5–15.5)
WBC: 16.9 10*3/uL — AB (ref 4.0–10.5)

## 2017-11-22 MED ORDER — HYDROMORPHONE HCL 1 MG/ML IJ SOLN: 2.0000 mg | INTRAMUSCULAR | Status: DC | PRN

## 2017-11-22 MED ORDER — HYDROMORPHONE HCL 1 MG/ML IJ SOLN
1.0000 mg | INTRAMUSCULAR | Status: DC | PRN
  Administered 2017-11-22 – 2017-11-23 (×5): 1 mg via INTRAVENOUS
  Filled 2017-11-22 (×5): qty 1

## 2017-11-22 MED ORDER — HYDROMORPHONE HCL 1 MG/ML IJ SOLN
1.0000 mg | Freq: Once | INTRAMUSCULAR | Status: AC
  Administered 2017-11-22: 1 mg via INTRAVENOUS
  Filled 2017-11-22: qty 1

## 2017-11-22 NOTE — Progress Notes (Signed)
CRITICAL VALUE ALERT  Critical Value:  Hbg 6.2  Date & Time Notied:  7:25  Provider Notified: Obie DredgeBlum   Orders Received/Actions taken: No action at this time. Awting orders if needed

## 2017-11-22 NOTE — Progress Notes (Addendum)
   Subjective: No acute events overnight. She reports that she still have leg and arm pain but it has improved since she has been here. She reports that the pain medications are helping. She has not had a bowel movement since she as been admitted.Reports some headache. Denies any chest pain. Reports some SOB which she states is nothing new, I reminded her that she has the albuterol available. She reports that she does not know when she will be able to get her hydroxyurea from home.   Objective:  Vital signs in last 24 hours: Vitals:   11/21/17 1339 11/21/17 1340 11/21/17 2025 11/22/17 0540  BP: 96/63 (!) 96/58 98/60 (!) 92/50  Pulse: 87 89 84 85  Resp:   20 20  Temp:   98.8 F (37.1 C) 98.4 F (36.9 C)  TempSrc:   Oral Oral  SpO2:   100% 100%  Weight:      Height:        General: Well nourished, well appearing, NAD  HEENT: Normocephalic, nontraumatic, midline trachea Cardiac: RRR, normal S1, S2, no murmurs, rubs or gallops  Pulmonary: Expiratory wheezing throughout lung fields, non-labored breathing Abdomen: Soft, non-tender, +bowel sounds, no guarding or masses noted ` Extremity: 1+ lower extremity edema bilaterally, tender to palpation in leg and upper arms, no muscle atrophy, no lesions or wounds noted Neuro: Alert and oriented x3, PERRLA, moves all extremities Psychiatry: Normal mood and affect     Assessment/Plan: This is a 37 year old female with a PMH of sickle cell crisis, on hydroxyurea, asthma, and tobacco use that presented with arm, leg and back pain consistent with an uncomplicated sickle cell crisis.  Sickle cell crisis: Patient presented with arm and leg pain, she denied any chest pain or shortness of breath.  -She has been denying the hydroxyurea states that she does not like the pill, she said the suspension would be okay -Hydroxyurea suspension -She has been on dilaudid 2 mg q2 hours, she has been taking it every 2 hours, reports the pain is well  controlled -Decrease dilaudid to 1mg  q4 hours -Incentive spirometry -D5 1/2 NS at 11650mL/hr -LDH: 282, Reticulocyte: >23 -Continue home cymbalta and gabapentin -Continue folic acid  Asthma:  -Reports shortness of breath, some wheezing on exam, no new oxygen requirements -Albuterol PRN  FEN: D5 1/2 NS 150cc/hr, replete lytes prn, regular diet  VTE ppx: Heparin Code Status: FULL    Dispo: Anticipated discharge in approximately 1-2 day(s).   Claudean SeveranceKrienke, Shondra Capps M, MD 11/22/2017, 6:04 AM Pager: [phone]

## 2017-11-22 NOTE — Progress Notes (Signed)
Medicine attending: Clinical status and ancillary data reviewed with resident physician Dr Hermine MessickMarissa Krienke and I concur with her evaluation and management plan which we discussed together. Hemoglobin has drifted down to 6.2 but her clinical status is stable to improving.  This likely reflects a dilutional effect of high volume hydration.  Oxygen saturation 100% on room air. No indication for transfusion under the circumstances per American Society of hematology guidelines. Dr Hermine MessickMarissa Krienke confirmed that the patient has been noncompliant with her Hydrea as I suspected.  I am not aware that a suspension exists.  We will need to look into this.  She is willing to take the drug if it is in a liquid form. She is still using narcotic analgesics every 2 hours and we need to taper her off this before discharge to avoid acute withdrawal.

## 2017-11-22 NOTE — Discharge Summary (Signed)
Name: Joanna [name] MRN: [id number] DOB: [birth year] 37 y.o. PCP: Patient, No Pcp Per  Date of Admission: 11/20/2017  5:11 AM Date of Discharge: 11/23/2017 11:51 AM Attending Physician: Annia Belt  Discharge Diagnosis: 1. Sickle cell crisis 2. Hepatic lesion 3. Asthma  Discharge Medications: Allergies as of 11/23/2017      Reactions   Ondansetron Hives, Itching, Rash      Metoclopramide Hives, Itching, Rash      Medication List    TAKE these medications   DULoxetine 30 MG capsule Commonly known as:  CYMBALTA Take 30-60 mg by mouth See admin instructions. Take 2 capsules (9m) in the morning and 1 capsule (365m in the evening.   fluticasone 50 MCG/ACT nasal spray Commonly known as:  FLONASE Place 1 spray into the nose daily as needed for allergies.   folic acid 1 MG tablet Commonly known as:  FOLVITE Take 1 mg by mouth daily.   gabapentin 100 MG capsule Commonly known as:  NEURONTIN Take 100 mg by mouth 3 (three) times daily.   hydroxyurea 100 mg/mL Susp Commonly known as:  HYDREA Take 1,000 mg by mouth daily.   methadone 5 MG tablet Commonly known as:  DOLOPHINE Take 5 mg by mouth every 12 (twelve) hours.   NARCAN 4 MG/0.1ML Liqd nasal spray kit Generic drug:  naloxone Place 1 spray into the nose once as needed (opiod overdose).   Oxycodone HCl 10 MG Tabs Take 10 mg by mouth every 4 (four) hours as needed (pain).   PROVENTIL HFA 108 (90 Base) MCG/ACT inhaler Generic drug:  albuterol Inhale 2 puffs into the lungs every 6 (six) hours as needed for wheezing.   risperiDONE 2 MG tablet Commonly known as:  RISPERDAL Take 2 mg by mouth 2 (two) times daily.       Disposition and follow-up:   [name]  At the hospital follow up visit please address:  1.   Pain control and compliance with her hydroxyurea.  She should get a triple phase abdominal CT scan in September 2019,  please follow up with the patient regarding options for her.    2.  Labs / imaging needed at time of follow-up: None  3.  Pending labs/ test needing follow-up: None  Follow-up Appointments: Follow-up Information    Dr. CaRoselind MessierGo to.   Why:  Please go to your scheduled appointment with your Hematologist Physician.           Hospital Course by problem list: 1. Sickle cell crisis: This is a [age]ear old female with a history of sickle cell disease (multiple admission for sickle cell pain crisis) who presented with bilateral arm, bilateral leg and back pain. No signs of infection. Denied any chest pain or shortness of breath. She reported moving stuff around and some dehydration which may have contributed to the crisis. CBC showed a WBC of 17, Hgb 8.4, MCV 101.2, differential showed sickle cells present and reticulocytes >24. LDH was 282. CXR showed no acute cardiopulmonary issues. U/A was benign. She was given 1L NS and dilaudid in the ED and continued to have pain. She was started on IV dilaudid 2 mg q2 hours, folic acid, hydroxyurea, and D5 1/2 NS. She refused the hydroxyurea because she did not like the pill form. Pharmacy reported that the suspension takes up to 24 hours to make and asked if she could bring her home medication however she was unable  to obtain it from home. Her Hgb dropped to 6.2, she remained asymptomatic and was oxygenating well. This was likely due to dilutional effect of the IV fluids so no transfusion was given.  Her Hgb increased to 7.0 the next day. IV dilaudid was decreased in frequency and dose to try and wean off the IV pain medications. Unfortunately she is on long acting opioids at home and these were not restarted until the next day so she was still having some pain the next day. Her home methadone was restarted. She reported that she felt better and was ready to go home. She was continued on her home hydrea and pain medications on discharge. We discussed with her the  importance of taking her hydrea. She has an appointment with Dr. [facility]elind Messier at [facility] this Friday.  2. Hepatic lesions: CT scan done on previous admission showed 2 low attenuation masses in the right lobe which were non-specific. She was supposed to get an MRI however she became claustrophobic and was unable to get the imaging. Radiology at that time recommended triple phase abdominal CT scan in September 2019, Follow up outpatient regarding options for her.   3. Asthma: She was continued on albuterol PRN.  Discharge Vitals:   BP 103/66 (BP Location: Left Arm)   Pulse 80   Temp 98.4 F (36.9 C) (Oral)   Resp 18   Ht '4\' 11"'  (1.499 m)   Wt 72.3 kg   LMP 11/15/2017 (Exact Date)   SpO2 100%   BMI 32.19 kg/m   Pertinent Labs, Studies, and Procedures:  CBC Latest Ref Rng & Units 11/23/2017 11/22/2017 11/21/2017  WBC 4.0 - 10.5 K/uL 18.9(H) 16.9(H) 15.6(H)  Hemoglobin 12.0 - 15.0 g/dL 7.0(L) 6.2(LL) 7.8(L)  Hematocrit 36.0 - 46.0 % 19.4(L) 17.2(L) 21.8(L)  Platelets 150 - 400 K/uL 420(H) 321 384     Discharge Instructions: Discharge Instructions    Call MD for:  severe uncontrolled pain   Complete by:  As directed    Diet - low sodium heart healthy   Complete by:  As directed    Increase activity slowly   Complete by:  As directed       Signed: Asencion Noble, MD 11/23/2017, 7:13 PM   Pager: [phone]

## 2017-11-23 DIAGNOSIS — I517 Cardiomegaly: Secondary | ICD-10-CM

## 2017-11-23 LAB — CBC
HEMATOCRIT: 19.4 % — AB (ref 36.0–46.0)
HEMOGLOBIN: 7 g/dL — AB (ref 12.0–15.0)
MCH: 34.5 pg — AB (ref 26.0–34.0)
MCHC: 36.1 g/dL — ABNORMAL HIGH (ref 30.0–36.0)
MCV: 95.6 fL (ref 78.0–100.0)
Platelets: 420 10*3/uL — ABNORMAL HIGH (ref 150–400)
RBC: 2.03 MIL/uL — AB (ref 3.87–5.11)
RDW: 25.7 % — ABNORMAL HIGH (ref 11.5–15.5)
WBC: 18.9 10*3/uL — ABNORMAL HIGH (ref 4.0–10.5)

## 2017-11-23 MED ORDER — HYDROMORPHONE HCL 1 MG/ML IJ SOLN
2.0000 mg | Freq: Once | INTRAMUSCULAR | Status: AC
Start: 2017-11-23 — End: 2017-11-23
  Administered 2017-11-23: 2 mg via INTRAVENOUS
  Filled 2017-11-23: qty 2

## 2017-11-23 MED ORDER — METHADONE HCL 10 MG PO TABS
5.0000 mg | ORAL_TABLET | Freq: Two times a day (BID) | ORAL | Status: DC
  Administered 2017-11-23: 5 mg via ORAL
  Filled 2017-11-23: qty 1

## 2017-11-23 MED ORDER — OXYCODONE HCL 5 MG PO TABS
10.0000 mg | ORAL_TABLET | ORAL | Status: DC | PRN
  Administered 2017-11-23: 10 mg via ORAL
  Filled 2017-11-23: qty 2

## 2017-11-23 NOTE — Progress Notes (Signed)
Medicine attending discharge note: I personally examined this patient on the day of discharge and I attest to the accuracy of the discharge evaluation and plan as recorded in the final progress note by resident physician Dr Hermine MessickMarissa Krienke and which will be further documented in her formal discharge summary.  37 year old woman with sickle cell disease who [place] living [place]  She presented on the day of the current admission August 9 with the acute onset of a typical crisis with pain in her upper and lower extremities.  No clear precipitating factors.  No significant change in her baseline hemolysis parameters compared with previous admissions.  She always has a high reticulocyte count.  Currently greater than 23%.  Baseline hemoglobin 8 g. She has a chronic leukocytosis 12-19,000. Baseline bilirubin 2-2 0.5.  Peak values recorded up to 4.1. Baseline LDH about 10% above normal with current lab reference value up to 192.  Value at time of current admission 282 compared with 216 in June at discharge from that admission.  Of note, this was her fourth admission to this hospital since June. Urine analysis negative for infection.  Chest x-ray with cardiomegaly.  No infiltrate or effusion. She was treated with parenteral analgesics, hydration, and folic acid.  Condition improved rapidly until narcotics were tapered and she had a flareup of extremity pain.  Although her hemoglobin fell as low as 6.2, clinical symptoms were improving and oxygenation was stable.  We considered that there was a dilutional component to her anemia from aggressive hydration.  We elected not to give a blood transfusion.  Hemoglobin trending back towards her baseline at discharge and was 7 g.  White count 18,900 and platelets 420,000.  Bilirubin 2.5 on admission, 2.1 at discharge. Review of her lab data suggested that she was not compliant with Hydrea and  she confirmed this on questioning.  I reinforced the importance of using Hydrea both as a way to increase her oxygen carrying capacity by increasing fetal hemoglobin and by reducing the inflammatory response to vaso-occlusion by lowering her white count.  She did not like the capsule form of the drug.  There is a oral suspension and she was willing to go back on the medication.  Disposition: Condition stable at time of discharge We resumed her outpatient methadone She has [facility] Dr. Sherron Flemingsarden scheduled for this Friday. There were no complications

## 2017-11-23 NOTE — Progress Notes (Signed)
   Subjective: Patient reports feeling okay today, she still has some pain but reports that it's better then when she came in. No acute events overnight. She denies any chest pain or SOB, has been having bowel movements. Her pain medications were decreased yesterday because we were trying to decrease the amount of IV medications that she gets. We discussed that we will restart her home pain medications to help with her pain and she stated that she feels like she is ready to go home. We talked about taking her hydrea when she gets home since she does not like the pill form. Pharmacy reported that it takes a while to make the suspension and asked her if she could bring her medication from home however she was not able do that. She reports that she has her pain medications at home.   Objective:  Vital signs in last 24 hours: Vitals:   11/22/17 0540 11/22/17 1341 11/22/17 2136 11/23/17 0449  BP: (!) 92/50 101/60 113/65 103/66  Pulse: 85 83 80 80  Resp: 20 18 18 18   Temp: 98.4 F (36.9 C) 98.7 F (37.1 C) 98.6 F (37 C) 98.4 F (36.9 C)  TempSrc: Oral Oral Oral Oral  SpO2: 100% 100% 100% 100%  Weight:      Height:        General: Well nourished, well appearing, NAD, sitting up in a chair HEENT: Normocephalic, nontraumatic, midline trachea Cardiac: RRR, normal S1, S2, no murmurs, rubs or gallops  Pulmonary: Lungs CTA bilaterally, no wheezing, rhonchi or rales  Abdomen: Soft, non-tender, +bowel sounds, no guarding or masses noted  Extremity: No LE edema, no muscle atrophy, no lesions or wounds noted, minimal tenderness to palpation in arms and legs Neuro: Alert and oriented x3, PERRLA, moves all extremities Psychiatry: Normal mood and affect    Assessment/Plan: This is a 37 year old female with a PMH of sickle cell crisis, on hydroxyurea, asthma, and tobacco use that presented with arm, leg and back pain consistent with an uncomplicated sickle cell crisis.  Sickle cell crisis: Patient  presented with arm and leg pain, denied any chest pain or SOB. She has been on D5 1/2 NS maintenance fluids. She reports feeling better then when she came in. She has not been taking the hydrea while she is here because she did not like the pill form. Her dilaudid amount and frequency was decreased yesterday to try and decrease the amount of IV medications that she gets. Her Hgb was 6.2 yesterday, did not transfuse because her symptoms were improving, oxygen saturation at 100% on RA. Hgb improved to 7 today. Her pain management doctor is  -Dilaudid q4 hours 1 mg -Stop fluids -One dose of 2 mg dilaudid  -Continue home Oxycodone IR q4 hours -Continue home Methadone 5 mg BID -Continue home folic acid -Continue home cymbalta and gabapentin  Asthma:  -Denies SOB today, no wheezing on exam -Albuterol PRN  Hepatic lesions:  -Abdominal CT on 09/21/17 showed 2 right hepatic lobe lesions, MRI was attempted on last hospitalization but became claustrophobic. She had a triple Phase Abdominal CT scan scheduled in September 2019.  -Follow up outpatient  FEN: No fluids, replete lytes prn, regular diet VTE ppx: Heparin Code Status: FULL    Dispo: Anticipated discharge in approximately today.   Claudean SeveranceKrienke, Marissa M, MD 11/23/2017, 7:05 AM Pager: [phone]

## 2017-11-23 NOTE — Progress Notes (Signed)
Patient came out of room stating she wanted to be discharged now or she will leave AMA. Paged MD and received discharge orders. Patient verbalizes understanding of discharge instructions. Patient provided AVS and instructed to follow up with Dr. Sherron Flemingsarden and PCP.

## 2017-11-23 NOTE — Discharge Instructions (Signed)
Joanna [name],   It has been a pleasure working with you and we are glad you're feeling better. You were hospitalized for sickle cell pain crisis, it was noted that you had liver lesions on your last admission.   For your sickle cell pain crisis,  You were treated with IV pain medications and IV fluids. Continue taking your home pain medications and follow up with your Hematologist doctor this Friday.     For your liver lesions,  Please follow up with your primary care provider to discuss imaging options.  Follow up with your primary care provider in 1-2 weeks  If your symptoms worsen or you develop new symptoms, please seek medical help whether it is your primary care provider or emergency department.  If you have any questions about this hospitalization please call [phone].

## 2017-11-26 ENCOUNTER — Emergency Department (HOSPITAL_COMMUNITY)
Admission: EM | Admit: 2017-11-26 | Discharge: 2017-11-26 | Disposition: A | Discharge status: Home / Self Care | Payer: Medicaid Other | Attending: Emergency Medicine | Admitting: Emergency Medicine

## 2017-11-26 ENCOUNTER — Encounter (HOSPITAL_COMMUNITY): Payer: Self-pay | Admitting: Emergency Medicine

## 2017-11-26 ENCOUNTER — Other Ambulatory Visit: Payer: Self-pay

## 2017-11-26 DIAGNOSIS — R110 Nausea: Secondary | ICD-10-CM | POA: Insufficient documentation

## 2017-11-26 DIAGNOSIS — J45909 Unspecified asthma, uncomplicated: Secondary | ICD-10-CM | POA: Diagnosis not present

## 2017-11-26 DIAGNOSIS — M79603 Pain in arm, unspecified: Secondary | ICD-10-CM | POA: Diagnosis present

## 2017-11-26 DIAGNOSIS — R809 Proteinuria, unspecified: Secondary | ICD-10-CM | POA: Diagnosis not present

## 2017-11-26 DIAGNOSIS — E876 Hypokalemia: Secondary | ICD-10-CM | POA: Diagnosis not present

## 2017-11-26 DIAGNOSIS — Z79899 Other long term (current) drug therapy: Secondary | ICD-10-CM | POA: Diagnosis not present

## 2017-11-26 DIAGNOSIS — D5700 Hb-SS disease with crisis, unspecified: Secondary | ICD-10-CM | POA: Diagnosis not present

## 2017-11-26 DIAGNOSIS — F17210 Nicotine dependence, cigarettes, uncomplicated: Secondary | ICD-10-CM | POA: Diagnosis not present

## 2017-11-26 DIAGNOSIS — R630 Anorexia: Secondary | ICD-10-CM | POA: Diagnosis not present

## 2017-11-26 LAB — CBC WITH DIFFERENTIAL/PLATELET
BLASTS: 0 %
Band Neutrophils: 1 %
Basophils Absolute: 0 10*3/uL (ref 0.0–0.1)
Basophils Relative: 0 %
Eosinophils Absolute: 0 10*3/uL (ref 0.0–0.7)
Eosinophils Relative: 0 %
HEMATOCRIT: 23.3 % — AB (ref 36.0–46.0)
Hemoglobin: 7.9 g/dL — ABNORMAL LOW (ref 12.0–15.0)
Lymphocytes Relative: 21 %
Lymphs Abs: 3.7 10*3/uL (ref 0.7–4.0)
MCH: 34.1 pg — ABNORMAL HIGH (ref 26.0–34.0)
MCHC: 33.9 g/dL (ref 30.0–36.0)
MCV: 100.4 fL — AB (ref 78.0–100.0)
MYELOCYTES: 0 %
Metamyelocytes Relative: 0 %
Monocytes Absolute: 0.4 10*3/uL (ref 0.1–1.0)
Monocytes Relative: 2 %
NEUTROS PCT: 76 %
NRBC: 0 /100{WBCs}
Neutro Abs: 13.7 10*3/uL — ABNORMAL HIGH (ref 1.7–7.7)
Other: 0 %
PLATELETS: 515 10*3/uL — AB (ref 150–400)
PROMYELOCYTES RELATIVE: 0 %
RBC: 2.32 MIL/uL — AB (ref 3.87–5.11)
RDW: 21.8 % — ABNORMAL HIGH (ref 11.5–15.5)
WBC: 17.8 10*3/uL — AB (ref 4.0–10.5)

## 2017-11-26 LAB — URINALYSIS, ROUTINE W REFLEX MICROSCOPIC
BILIRUBIN URINE: NEGATIVE
Bacteria, UA: NONE SEEN
GLUCOSE, UA: NEGATIVE mg/dL
HGB URINE DIPSTICK: NEGATIVE
KETONES UR: NEGATIVE mg/dL
LEUKOCYTES UA: NEGATIVE
NITRITE: NEGATIVE
PH: 6 (ref 5.0–8.0)
Protein, ur: 100 mg/dL — AB
Specific Gravity, Urine: 1.012 (ref 1.005–1.030)

## 2017-11-26 LAB — COMPREHENSIVE METABOLIC PANEL
ALK PHOS: 104 U/L (ref 38–126)
ALT: 15 U/L (ref 0–44)
AST: 20 U/L (ref 15–41)
Albumin: 4.2 g/dL (ref 3.5–5.0)
Anion gap: 10 (ref 5–15)
BUN: <5 mg/dL — ABNORMAL LOW (ref 6–20)
CALCIUM: 9.2 mg/dL (ref 8.9–10.3)
CO2: 20 mmol/L — ABNORMAL LOW (ref 22–32)
CREATININE: 0.55 mg/dL (ref 0.44–1.00)
Chloride: 107 mmol/L (ref 98–111)
Glucose, Bld: 110 mg/dL — ABNORMAL HIGH (ref 70–99)
Potassium: 3.1 mmol/L — ABNORMAL LOW (ref 3.5–5.1)
Sodium: 137 mmol/L (ref 135–145)
Total Bilirubin: 3 mg/dL — ABNORMAL HIGH (ref 0.3–1.2)
Total Protein: 6.9 g/dL (ref 6.5–8.1)

## 2017-11-26 LAB — I-STAT BETA HCG BLOOD, ED (MC, WL, AP ONLY)

## 2017-11-26 LAB — RETICULOCYTES
RBC.: 2.32 MIL/uL — ABNORMAL LOW (ref 3.87–5.11)
Retic Ct Pct: >23 % — ABNORMAL HIGH (ref 0.4–3.1)

## 2017-11-26 LAB — POC URINE PREG, ED: Preg Test, Ur: NEGATIVE

## 2017-11-26 MED ORDER — HYDROMORPHONE HCL 1 MG/ML IJ SOLN: 2.0000 mg | INTRAMUSCULAR | Status: AC

## 2017-11-26 MED ORDER — PROMETHAZINE HCL 25 MG/ML IJ SOLN
25.0000 mg | Freq: Once | INTRAMUSCULAR | Status: AC
  Administered 2017-11-26: 25 mg via INTRAMUSCULAR
  Filled 2017-11-26: qty 1

## 2017-11-26 MED ORDER — KETOROLAC TROMETHAMINE 30 MG/ML IJ SOLN: 30.0000 mg | INTRAMUSCULAR | Status: DC

## 2017-11-26 MED ORDER — PROMETHAZINE HCL 25 MG/ML IJ SOLN: 25.0000 mg | Freq: Once | INTRAMUSCULAR | Status: DC

## 2017-11-26 MED ORDER — HYDROMORPHONE HCL 1 MG/ML IJ SOLN
2.0000 mg | INTRAMUSCULAR | Status: AC
  Administered 2017-11-26: 2 mg via SUBCUTANEOUS
  Filled 2017-11-26: qty 2

## 2017-11-26 MED ORDER — PROMETHAZINE HCL 25 MG PO TABS: 25.0000 mg | ORAL_TABLET | Freq: Four times a day (QID) | ORAL | 0 refills | Status: AC | PRN

## 2017-11-26 MED ORDER — SODIUM CHLORIDE 0.45 % IV SOLN: INTRAVENOUS | Status: DC

## 2017-11-26 MED ORDER — HYDROMORPHONE HCL 1 MG/ML IJ SOLN
2.0000 mg | INTRAMUSCULAR | Status: DC
  Administered 2017-11-26: 2 mg via SUBCUTANEOUS
  Filled 2017-11-26: qty 2

## 2017-11-26 MED ORDER — POTASSIUM CHLORIDE CRYS ER 20 MEQ PO TBCR
40.0000 meq | EXTENDED_RELEASE_TABLET | Freq: Once | ORAL | Status: AC
  Administered 2017-11-26: 40 meq via ORAL
  Filled 2017-11-26: qty 2

## 2017-11-26 MED ORDER — HYDROMORPHONE HCL 1 MG/ML IJ SOLN: 2.0000 mg | INTRAMUSCULAR | Status: DC

## 2017-11-26 MED ORDER — DIPHENHYDRAMINE HCL 25 MG PO CAPS
50.0000 mg | ORAL_CAPSULE | Freq: Once | ORAL | Status: AC
  Administered 2017-11-26: 50 mg via ORAL
  Filled 2017-11-26: qty 2

## 2017-11-26 NOTE — Discharge Instructions (Addendum)
1. Medications: phenergan for nausea, usual home medications 2. Treatment: rest, drink plenty of fluids, eat when you are able 3. Follow Up: Please followup with your hematologist at your appointment today; Please return to the ER for fever, chest pain, SOB, worsening pain or other concerns

## 2017-11-26 NOTE — ED Provider Notes (Signed)
[facility] Provider Note   CSN: [id number] Arrival date & time: 11/26/17  0244     History   Chief Complaint Chief Complaint  Patient presents with  . Sickle Cell Pain Crisis    HPI Joanna [name] is a 37 y.o. female with a hx of asthma, sickle cell anemia presents to the Emergency Department complaining of gradual, persistent, progressively worsening bilateral leg and arm pain ongoing for the last 2+ days.  Patient reports she has been compliant with her hydroxyurea, methadone and oxycodone however she reports the oxycodone is not helping her pain.  She reports pain in her buttocks and bilateral legs as well as lower arms.  Record review shows that patient was evaluated on 11/20/2017 for similar pain and admitted to the hospital.  She was discharged on 11/23/2017.  She reports at that time her pain was improved but has worsened over the last 2 days.  She currently rates her pain at a 9/10.  She denies fever, chills, sweats, cough, chest pain, abdominal pain.  She does endorse some nausea but denies vomiting or diarrhea.  She reports she has a follow-up with her hematologist tomorrow morning.  The history is provided by the patient and medical records. No language interpreter was used.    Past Medical History:  Diagnosis Date  . Asthma   . Sickle cell anemia [facility](HCC)     Patient Active Problem List   Diagnosis Date Noted  . Community acquired pneumonia   . Sickle cell crisis (HCC) 09/28/2017  . Sickle cell anemia with pain (HCC) 09/28/2017  . Sickle cell pain crisis (HCC) 09/21/2017  . Abdominal pain 09/21/2017  . Tobacco abuse 09/21/2017    History reviewed. No pertinent surgical history.   OB History    Gravida  1   Para      Term      Preterm      AB      Living        SAB      TAB      Ectopic      Multiple      Live Births               Home Medications    Prior to Admission medications   Medication Sig Start  Date End Date Taking? Authorizing Provider  albuterol (PROVENTIL HFA) 108 (90 Base) MCG/ACT inhaler Inhale 2 puffs into the lungs every 6 (six) hours as needed for wheezing.  12/20/13  Yes [provider]  DULoxetine (CYMBALTA) 30 MG capsule Take 30-60 mg by mouth See admin instructions. Take 2 capsules (60mg ) in the morning and 1 capsule (30mg ) in the evening. 09/04/17  Yes [provider]  fluticasone (FLONASE) 50 MCG/ACT nasal spray Place 1 spray into the nose daily as needed for allergies.    Yes [provider]  folic acid (FOLVITE) 1 MG tablet Take 1 mg by mouth daily.   Yes [provider]  gabapentin (NEURONTIN) 100 MG capsule Take 100 mg by mouth 3 (three) times daily. 08/17/17  Yes [provider]  hydroxyurea (HYDREA) 100 mg/mL SUSP Take 1,000 mg by mouth daily.    Yes [provider]  methadone (DOLOPHINE) 5 MG tablet Take 5 mg by mouth every 12 (twelve) hours.   Yes [provider]  naloxone (NARCAN) nasal spray 4 mg/0.1 mL Place 1 spray into the nose once as needed (opiod overdose).  11/21/14  Yes [provider]  Oxycodone HCl 10 MG TABS Take 10 mg by mouth every 4 (four) hours as needed (pain).    Yes [provider]  risperiDONE (RISPERDAL) 2 MG tablet Take 2 mg by mouth 2 (two) times daily. 10/03/17  Yes [provider]  promethazine (PHENERGAN) 25 MG tablet Take 1 tablet (25 mg total) by mouth every 6 (six) hours as needed for nausea or vomiting. 11/26/17   Taraoluwa Thakur, Dahlia Client, PA-C    Family History Family History  Problem Relation Age of Onset  . Diabetes Father     Social History Social History   Tobacco Use  . Smoking status: Current Every Day Smoker    Packs/day: 1.00  . Smokeless tobacco: Current User  Substance Use Topics  . Alcohol use: Not Currently    Comment: "every blue moon"  . Drug use: Not Currently     Allergies   Ondansetron and Metoclopramide   Review of  Systems Review of Systems  Constitutional: Negative for appetite change, diaphoresis, fatigue, fever and unexpected weight change.  HENT: Negative for mouth sores.   Eyes: Negative for visual disturbance.  Respiratory: Negative for cough, chest tightness, shortness of breath and wheezing.   Cardiovascular: Negative for chest pain.  Gastrointestinal: Positive for nausea. Negative for abdominal pain, constipation, diarrhea and vomiting.  Endocrine: Negative for polydipsia, polyphagia and polyuria.  Genitourinary: Negative for dysuria, frequency, hematuria and urgency.  Musculoskeletal: Positive for arthralgias and myalgias. Negative for back pain and neck stiffness.  Skin: Negative for rash.  Allergic/Immunologic: Negative for immunocompromised state.  Neurological: Negative for syncope, light-headedness and headaches.  Hematological: Does not bruise/bleed easily.  Psychiatric/Behavioral: Negative for sleep disturbance. The patient is not nervous/anxious.      Physical Exam Updated Vital Signs BP 111/76   Pulse 87   Temp 98.7 F (37.1 C) (Oral)   Resp (!) 22   Ht 4\' 11"  (1.499 m)   Wt 72.3 kg   LMP 11/15/2017 (Exact Date)   SpO2 97%   BMI 32.19 kg/m   Physical Exam  Constitutional: She is oriented to person, place, and time. She appears well-developed and well-nourished. No distress.  Awake, alert, nontoxic appearing, NAD  HENT:  Head: Normocephalic and atraumatic.  Mouth/Throat: Oropharynx is clear and moist.  Eyes: Conjunctivae are normal. No scleral icterus.  Neck: Normal range of motion. Neck supple.  Cardiovascular: Normal rate, regular rhythm and intact distal pulses.  Pulses:      Radial pulses are 2+ on the right side, and 2+ on the left side.       Dorsalis pedis pulses are 2+ on the right side, and 2+ on the left side.       Posterior tibial pulses are 2+ on the right side, and 2+ on the left side.  Capillary refill < 3 sec  Pulmonary/Chest: Effort normal and  breath sounds normal. No accessory muscle usage. No tachypnea. No respiratory distress. She has no decreased breath sounds. She has no wheezes. She has no rhonchi. She has no rales. She exhibits no bony tenderness.  Abdominal: Soft. Bowel sounds are normal. She exhibits no distension. There is no tenderness. There is no guarding and no CVA tenderness.  Musculoskeletal: Normal range of motion. She exhibits tenderness.  Full ROM of all major joints No erythema or swelling of any major joint; TTP along the bilateral knees and ankles  Lymphadenopathy:    She has no cervical adenopathy.  Neurological: She is alert and oriented to person, place, and  time.  Skin: Skin is warm and dry. She is not diaphoretic. No erythema.  Psychiatric: She has a normal mood and affect. Her behavior is normal.  Nursing note and vitals reviewed.    ED Treatments / Results  Labs (all labs ordered are listed, but only abnormal results are displayed) Labs Reviewed  COMPREHENSIVE METABOLIC PANEL - Abnormal; Notable for the following components:      Result Value   Potassium 3.1 (*)    CO2 20 (*)    Glucose, Bld 110 (*)    BUN <5 (*)    Total Bilirubin 3.0 (*)    All other components within normal limits  CBC WITH DIFFERENTIAL/PLATELET - Abnormal; Notable for the following components:   WBC 17.8 (*)    RBC 2.32 (*)    Hemoglobin 7.9 (*)    HCT 23.3 (*)    MCV 100.4 (*)    MCH 34.1 (*)    RDW 21.8 (*)    Platelets 515 (*)    Neutro Abs 13.7 (*)    All other components within normal limits  RETICULOCYTES - Abnormal; Notable for the following components:   Retic Ct Pct >23.0 (*)    RBC. 2.32 (*)    All other components within normal limits  URINALYSIS, ROUTINE W REFLEX MICROSCOPIC - Abnormal; Notable for the following components:   Protein, ur 100 (*)    All other components within normal limits  I-STAT BETA HCG BLOOD, ED ([facility], [facility], [facility] ONLY)  POC URINE PREG, ED     Procedures Procedures (including  critical care time)  Medications Ordered in ED Medications  0.45 % sodium chloride infusion (has no administration in time range)  ketorolac (TORADOL) 30 MG/ML injection 30 mg (has no administration in time range)  HYDROmorphone (DILAUDID) injection 2 mg ( Intravenous See Alternative 11/26/17 0650)    Or  HYDROmorphone (DILAUDID) injection 2 mg (2 mg Subcutaneous Given 11/26/17 0650)  potassium chloride SA (K-DUR,KLOR-CON) CR tablet 40 mEq (has no administration in time range)  HYDROmorphone (DILAUDID) injection 2 mg ( Intravenous See Alternative 11/26/17 0421)    Or  HYDROmorphone (DILAUDID) injection 2 mg (2 mg Subcutaneous Given 11/26/17 0421)  HYDROmorphone (DILAUDID) injection 2 mg ( Intravenous See Alternative 11/26/17 0550)    Or  HYDROmorphone (DILAUDID) injection 2 mg (2 mg Subcutaneous Given 11/26/17 0550)  HYDROmorphone (DILAUDID) injection 2 mg ( Intravenous See Alternative 11/26/17 0618)    Or  HYDROmorphone (DILAUDID) injection 2 mg (2 mg Subcutaneous Given 11/26/17 0618)  diphenhydrAMINE (BENADRYL) capsule 50 mg (50 mg Oral Given 11/26/17 0711)  promethazine (PHENERGAN) injection 25 mg (25 mg Intramuscular Given 11/26/17 0651)     Initial Impression / Assessment and Plan / ED Course  I have reviewed the triage vital signs and the nursing notes.  Pertinent labs & imaging results that were available during my care of the patient were reviewed by me and considered in my medical decision making (see chart for details).  Clinical Course as of Nov 26 753  Thu Nov 26, 2017  1610 Nursing and IV team have been unable to obtain IV access.  Discussion with patient.  She does not wish for additional IV attempts.  I have agreed to 1 additional dose of subcu Dilaudid.  She will see her hematologist this morning.   [HM]  0750 baseline  Hemoglobin(!): 7.9 [HM]  0750 Elevated previously  WBC(!): 17.8 [HM]  0750 Hypokalemia.  Potassium given in the ED  Potassium(!): 3.1 [HM]  0750 Elevated  with previous values elevated  Total Bilirubin(!): 3.0 [HM]  0750 Noted.  No AG.  CO2(!): 20 [HM]  0751 noted  Protein(!): 100 [HM]  0751 Unchanged from baseline  Retic Ct Pct(!): >23.0 [HM]  0751 afebrile  Temp: 98.7 F (37.1 C) [HM]  0751 No tachycardia  Pulse Rate: 87 [HM]  0751 No hypotension  BP: 111/76 [HM]    Clinical Course User Index [HM] Loys Hoselton, Dahlia ClientHannah, PA-C    Patient presents with sickle cell pain.  This is been ongoing for several weeks but worse in the last 2 days after her discharge from the hospital.  Significant difficulty obtaining an IV.  We were able to obtain blood and patient was given subcu Dilaudid and IM Phenergan.  Risk and benefits of continuing to search for IV discussed with the patient.  I do feel with her decreased appetite and lack of p.o. intake fluids would be important.  Her labs have not yet resulted.  At this time she does not wish for any further IV attempts.  7:55 AM Patient reports her pain is now 4/10.  She wishes for discharge home.  Her labs have reported.  She does have a CO2 but no anion gap.  Mild proteinuria noted.  Anemia is at baseline.  She is afebrile without tachycardia or hypotension.  Patient also does have hypokalemia.  Oral replacement given.  She has been able to eat and drink here in the emergency department without any difficulty.  Suspect some element of dehydration.  These abnormal results were reviewed with the patient and she was given a copy of these to show to her hematologist today.  She again declines further treatment here in the emergency department.  She will see her hematologist today.  Discussed reasons to return immediately to the emergency department.  Patient states understanding and is in agreement with this plan.  Final Clinical Impressions(s) / ED Diagnoses   Final diagnoses:  Sickle cell pain crisis (HCC)  Hypokalemia  Proteinuria, unspecified type  Decreased appetite  Nausea    ED Discharge Orders          Ordered    promethazine (PHENERGAN) 25 MG tablet  Every 6 hours PRN     11/26/17 0752           Sherrill Buikema, Dahlia ClientHannah, PA-C 11/26/17 0755    Zadie RhineWickline, Donald, MD 11/27/17 1002

## 2017-11-26 NOTE — ED Triage Notes (Signed)
Pt reports bilateral leg pain and arm pain X 1 week. Pt seen here 11/20/17 for same, was admitted until 11/23/17. Pt states pain is still present.

## 2017-11-26 NOTE — ED Notes (Signed)
Writer attempted to collect unsuccessful X1, pt stated she prefers for her blood work to be drawn when IV is started.

## 2017-11-26 NOTE — ED Notes (Signed)
Pt unable to urinate.

## 2017-11-26 NOTE — Progress Notes (Signed)
This Clinical research associatewriter was 2nd Charity fundraiserN to assess and attempt PIV placement with Ultrasound.  Patient very difficult stick due to scar tissue from having numerous PIVs. Patient telling this Clinical research associatewriter where to stick and where not to stick.  IV attempt was unsuccessful x1 in right upper arm with ultrasound.  This Clinical research associatewriter explained to patient per hospital policy, once 2 IV RN have assessed and/or attempted PIV placement (with unsuccessful placement), the MD is notified. Patient verbalized understanding and patient RN notified and verbalized understanding.

## 2017-11-26 NOTE — ED Notes (Signed)
Patient verbalizes understanding of discharge instructions. Opportunity for questioning and answers were provided. Armband removed by staff, pt discharged from ED.  

## 2018-09-08 ENCOUNTER — Encounter (HOSPITAL_COMMUNITY): Payer: Self-pay

## 2018-09-08 ENCOUNTER — Encounter (HOSPITAL_COMMUNITY): Payer: Self-pay | Admitting: Emergency Medicine

## 2018-09-08 ENCOUNTER — Emergency Department (HOSPITAL_COMMUNITY): Payer: Medicaid Other

## 2018-09-08 ENCOUNTER — Emergency Department (HOSPITAL_BASED_OUTPATIENT_CLINIC_OR_DEPARTMENT_OTHER): Payer: Medicaid Other

## 2018-09-08 ENCOUNTER — Other Ambulatory Visit: Payer: Self-pay

## 2018-09-08 ENCOUNTER — Emergency Department (HOSPITAL_COMMUNITY)
Admission: EM | Admit: 2018-09-08 | Discharge: 2018-09-08 | Disposition: A | Discharge status: Home / Self Care | Payer: Medicaid Other | Source: Home / Self Care | Attending: Emergency Medicine | Admitting: Emergency Medicine

## 2018-09-08 ENCOUNTER — Inpatient Hospital Stay (HOSPITAL_COMMUNITY)
Admission: EM | Admit: 2018-09-08 | Discharge: 2018-09-09 | DRG: 812 | Disposition: A | Discharge status: Home / Self Care | Payer: Medicaid Other | Attending: Internal Medicine | Admitting: Internal Medicine

## 2018-09-08 DIAGNOSIS — R109 Unspecified abdominal pain: Secondary | ICD-10-CM | POA: Diagnosis not present

## 2018-09-08 DIAGNOSIS — J189 Pneumonia, unspecified organism: Secondary | ICD-10-CM | POA: Diagnosis not present

## 2018-09-08 DIAGNOSIS — G894 Chronic pain syndrome: Secondary | ICD-10-CM | POA: Diagnosis present

## 2018-09-08 DIAGNOSIS — M79604 Pain in right leg: Secondary | ICD-10-CM | POA: Insufficient documentation

## 2018-09-08 DIAGNOSIS — D638 Anemia in other chronic diseases classified elsewhere: Secondary | ICD-10-CM | POA: Diagnosis present

## 2018-09-08 DIAGNOSIS — Z79899 Other long term (current) drug therapy: Secondary | ICD-10-CM

## 2018-09-08 DIAGNOSIS — D72829 Elevated white blood cell count, unspecified: Secondary | ICD-10-CM | POA: Insufficient documentation

## 2018-09-08 DIAGNOSIS — Z7951 Long term (current) use of inhaled steroids: Secondary | ICD-10-CM

## 2018-09-08 DIAGNOSIS — J4520 Mild intermittent asthma, uncomplicated: Secondary | ICD-10-CM | POA: Diagnosis present

## 2018-09-08 DIAGNOSIS — R0902 Hypoxemia: Secondary | ICD-10-CM

## 2018-09-08 DIAGNOSIS — F17210 Nicotine dependence, cigarettes, uncomplicated: Secondary | ICD-10-CM | POA: Diagnosis present

## 2018-09-08 DIAGNOSIS — D5700 Hb-SS disease with crisis, unspecified: Secondary | ICD-10-CM

## 2018-09-08 DIAGNOSIS — Z20828 Contact with and (suspected) exposure to other viral communicable diseases: Secondary | ICD-10-CM | POA: Diagnosis present

## 2018-09-08 DIAGNOSIS — Z86711 Personal history of pulmonary embolism: Secondary | ICD-10-CM

## 2018-09-08 DIAGNOSIS — F209 Schizophrenia, unspecified: Secondary | ICD-10-CM | POA: Diagnosis present

## 2018-09-08 DIAGNOSIS — J45909 Unspecified asthma, uncomplicated: Secondary | ICD-10-CM | POA: Insufficient documentation

## 2018-09-08 DIAGNOSIS — I340 Nonrheumatic mitral (valve) insufficiency: Secondary | ICD-10-CM | POA: Diagnosis not present

## 2018-09-08 DIAGNOSIS — F1120 Opioid dependence, uncomplicated: Secondary | ICD-10-CM | POA: Diagnosis present

## 2018-09-08 DIAGNOSIS — R609 Edema, unspecified: Secondary | ICD-10-CM | POA: Diagnosis not present

## 2018-09-08 DIAGNOSIS — M79605 Pain in left leg: Secondary | ICD-10-CM

## 2018-09-08 DIAGNOSIS — Z888 Allergy status to other drugs, medicaments and biological substances status: Secondary | ICD-10-CM

## 2018-09-08 DIAGNOSIS — I361 Nonrheumatic tricuspid (valve) insufficiency: Secondary | ICD-10-CM | POA: Diagnosis not present

## 2018-09-08 LAB — RETICULOCYTES
Immature Retic Fract: 33.4 % — ABNORMAL HIGH (ref 2.3–15.9)
Immature Retic Fract: 24.4 % — ABNORMAL HIGH (ref 2.3–15.9)
RBC.: 2.38 MIL/uL — ABNORMAL LOW (ref 3.87–5.11)
RBC.: 2.6 MIL/uL — ABNORMAL LOW (ref 3.87–5.11)
Retic Count, Absolute: 825.6 10*3/uL — ABNORMAL HIGH (ref 19.0–186.0)
Retic Count, Absolute: 953 10*3/uL — ABNORMAL HIGH (ref 19.0–186.0)
Retic Ct Pct: 34.7 % — ABNORMAL HIGH (ref 0.4–3.1)
Retic Ct Pct: 36.7 % — ABNORMAL HIGH (ref 0.4–3.1)

## 2018-09-08 LAB — BASIC METABOLIC PANEL
Anion gap: 9 (ref 5–15)
BUN: 8 mg/dL (ref 6–20)
CO2: 21 mmol/L — ABNORMAL LOW (ref 22–32)
Calcium: 8.6 mg/dL — ABNORMAL LOW (ref 8.9–10.3)
Chloride: 107 mmol/L (ref 98–111)
Creatinine, Ser: 0.55 mg/dL (ref 0.44–1.00)
GFR calc Af Amer: >60 mL/min (ref >60)
GFR calc non Af Amer: >60 mL/min (ref >60)
Glucose, Bld: 101 mg/dL — ABNORMAL HIGH (ref 70–99)
Potassium: 3.9 mmol/L (ref 3.5–5.1)
Sodium: 137 mmol/L (ref 135–145)

## 2018-09-08 LAB — CBC WITH DIFFERENTIAL/PLATELET
Abs Immature Granulocytes: 0.2 10*3/uL — ABNORMAL HIGH (ref 0.00–0.07)
Abs Immature Granulocytes: 0.2 10*3/uL — ABNORMAL HIGH (ref 0.00–0.07)
Basophils Absolute: 0 10*3/uL (ref 0.0–0.1)
Basophils Absolute: 0 10*3/uL (ref 0.0–0.1)
Basophils Relative: 0 %
Basophils Relative: 0 %
Eosinophils Absolute: 0.4 10*3/uL (ref 0.0–0.5)
Eosinophils Absolute: 0 10*3/uL (ref 0.0–0.5)
Eosinophils Relative: 2 %
Eosinophils Relative: 1 %
HCT: 21.8 % — ABNORMAL LOW (ref 36.0–46.0)
HCT: 25 % — ABNORMAL LOW (ref 36.0–46.0)
Hemoglobin: 7.8 g/dL — ABNORMAL LOW (ref 12.0–15.0)
Hemoglobin: 8 g/dL — ABNORMAL LOW (ref 12.0–15.0)
Immature Granulocytes: 1 %
Immature Granulocytes: 1 %
Lymphocytes Relative: 21 %
Lymphocytes Relative: 13 %
Lymphs Abs: 4.2 10*3/uL — ABNORMAL HIGH (ref 0.7–4.0)
Lymphs Abs: 3 10*3/uL (ref 0.7–4.0)
MCH: 32.8 pg (ref 26.0–34.0)
MCH: 30.8 pg (ref 26.0–34.0)
MCHC: 35.8 g/dL (ref 30.0–36.0)
MCHC: 32 g/dL (ref 30.0–36.0)
MCV: 91.6 fL (ref 80.0–100.0)
MCV: 96.2 fL (ref 80.0–100.0)
Monocytes Absolute: 1.6 10*3/uL — ABNORMAL HIGH (ref 0.1–1.0)
Monocytes Absolute: 1.5 10*3/uL — ABNORMAL HIGH (ref 0.1–1.0)
Monocytes Relative: 8 %
Monocytes Relative: 6 %
Neutro Abs: 13.4 10*3/uL — ABNORMAL HIGH (ref 1.7–7.7)
Neutro Abs: 18.5 10*3/uL — ABNORMAL HIGH (ref 1.7–7.7)
Neutrophils Relative %: 68 %
Neutrophils Relative %: 79 %
Platelets: 428 10*3/uL — ABNORMAL HIGH (ref 150–400)
Platelets: 540 10*3/uL — ABNORMAL HIGH (ref 150–400)
RBC: 2.38 MIL/uL — ABNORMAL LOW (ref 3.87–5.11)
RBC: 2.6 MIL/uL — ABNORMAL LOW (ref 3.87–5.11)
RDW: 25.4 % — ABNORMAL HIGH (ref 11.5–15.5)
RDW: 25.1 % — ABNORMAL HIGH (ref 11.5–15.5)
WBC: 19.6 10*3/uL — ABNORMAL HIGH (ref 4.0–10.5)
WBC: 23.5 10*3/uL — ABNORMAL HIGH (ref 4.0–10.5)
nRBC: 8.6 % — ABNORMAL HIGH (ref 0.0–0.2)
nRBC: 6.4 % — ABNORMAL HIGH (ref 0.0–0.2)

## 2018-09-08 LAB — COMPREHENSIVE METABOLIC PANEL
ALT: 12 U/L (ref 0–44)
AST: 23 U/L (ref 15–41)
Albumin: 3.6 g/dL (ref 3.5–5.0)
Alkaline Phosphatase: 133 U/L — ABNORMAL HIGH (ref 38–126)
Anion gap: 9 (ref 5–15)
BUN: 7 mg/dL (ref 6–20)
CO2: 22 mmol/L (ref 22–32)
Calcium: 8.5 mg/dL — ABNORMAL LOW (ref 8.9–10.3)
Chloride: 106 mmol/L (ref 98–111)
Creatinine, Ser: 0.57 mg/dL (ref 0.44–1.00)
GFR calc Af Amer: >60 mL/min (ref >60)
GFR calc non Af Amer: >60 mL/min (ref >60)
Glucose, Bld: 98 mg/dL (ref 70–99)
Potassium: 4.1 mmol/L (ref 3.5–5.1)
Sodium: 137 mmol/L (ref 135–145)
Total Bilirubin: 2.5 mg/dL — ABNORMAL HIGH (ref 0.3–1.2)
Total Protein: 6.2 g/dL — ABNORMAL LOW (ref 6.5–8.1)

## 2018-09-08 LAB — I-STAT BETA HCG BLOOD, ED (MC, WL, AP ONLY): I-stat hCG, quantitative: <5 m[IU]/mL (ref <5)

## 2018-09-08 LAB — SARS CORONAVIRUS 2 BY RT PCR (HOSPITAL ORDER, PERFORMED IN ~~LOC~~ HOSPITAL LAB): SARS Coronavirus 2: NEGATIVE

## 2018-09-08 MED ORDER — SENNOSIDES-DOCUSATE SODIUM 8.6-50 MG PO TABS
1.0000 | ORAL_TABLET | Freq: Two times a day (BID) | ORAL | Status: DC
  Administered 2018-09-09 (×2): 1 via ORAL
  Filled 2018-09-08 (×2): qty 1

## 2018-09-08 MED ORDER — NALOXONE HCL 0.4 MG/ML IJ SOLN: 0.4000 mg | INTRAMUSCULAR | Status: DC | PRN

## 2018-09-08 MED ORDER — HYDROMORPHONE HCL 1 MG/ML IJ SOLN
2.0000 mg | INTRAMUSCULAR | Status: AC
  Administered 2018-09-08: 2 mg via INTRAVENOUS
  Filled 2018-09-08: qty 2

## 2018-09-08 MED ORDER — HYDROMORPHONE HCL 1 MG/ML IJ SOLN: 2.0000 mg | INTRAMUSCULAR | Status: AC

## 2018-09-08 MED ORDER — HEPARIN SOD (PORK) LOCK FLUSH 100 UNIT/ML IV SOLN
500.0000 [IU] | Freq: Once | INTRAVENOUS | Status: DC
  Filled 2018-09-08: qty 5

## 2018-09-08 MED ORDER — POLYETHYLENE GLYCOL 3350 17 G PO PACK: 17.0000 g | PACK | Freq: Every day | ORAL | Status: DC | PRN

## 2018-09-08 MED ORDER — HYDROMORPHONE HCL 1 MG/ML IJ SOLN
2.0000 mg | Freq: Once | INTRAMUSCULAR | Status: AC
  Administered 2018-09-08: 2 mg via INTRAVENOUS
  Filled 2018-09-08: qty 2

## 2018-09-08 MED ORDER — ENOXAPARIN SODIUM 40 MG/0.4ML ~~LOC~~ SOLN
40.0000 mg | SUBCUTANEOUS | Status: DC
  Filled 2018-09-08: qty 0.4

## 2018-09-08 MED ORDER — METHADONE HCL 10 MG PO TABS
5.0000 mg | ORAL_TABLET | Freq: Two times a day (BID) | ORAL | Status: DC
  Administered 2018-09-09 (×2): 5 mg via ORAL
  Filled 2018-09-08 (×2): qty 1

## 2018-09-08 MED ORDER — ALBUTEROL SULFATE (2.5 MG/3ML) 0.083% IN NEBU: 2.5000 mg | INHALATION_SOLUTION | Freq: Four times a day (QID) | RESPIRATORY_TRACT | Status: DC | PRN

## 2018-09-08 MED ORDER — KETOROLAC TROMETHAMINE 15 MG/ML IJ SOLN
15.0000 mg | Freq: Four times a day (QID) | INTRAMUSCULAR | Status: DC
  Administered 2018-09-09 (×3): 15 mg via INTRAVENOUS
  Filled 2018-09-08 (×3): qty 1

## 2018-09-08 MED ORDER — SODIUM CHLORIDE 0.9% FLUSH: 9.0000 mL | INTRAVENOUS | Status: DC | PRN

## 2018-09-08 MED ORDER — KETOROLAC TROMETHAMINE 15 MG/ML IJ SOLN
15.0000 mg | INTRAMUSCULAR | Status: AC
  Administered 2018-09-08: 18:00:00 15 mg via INTRAVENOUS
  Filled 2018-09-08: qty 1

## 2018-09-08 MED ORDER — PROMETHAZINE HCL 25 MG/ML IJ SOLN
12.5000 mg | Freq: Once | INTRAMUSCULAR | Status: AC
  Administered 2018-09-08: 20:00:00 12.5 mg via INTRAVENOUS
  Filled 2018-09-08: qty 1

## 2018-09-08 MED ORDER — HYDROMORPHONE HCL 1 MG/ML IJ SOLN
2.0000 mg | Freq: Once | INTRAMUSCULAR | Status: AC
  Administered 2018-09-08: 18:00:00 2 mg via INTRAVENOUS
  Filled 2018-09-08: qty 2

## 2018-09-08 MED ORDER — DIPHENHYDRAMINE HCL 25 MG PO CAPS
25.0000 mg | ORAL_CAPSULE | ORAL | Status: DC | PRN
  Administered 2018-09-08: 50 mg via ORAL
  Filled 2018-09-08: qty 2

## 2018-09-08 MED ORDER — HYDROMORPHONE 1 MG/ML IV SOLN
INTRAVENOUS | Status: DC
  Administered 2018-09-09: 01:00:00 via INTRAVENOUS
  Filled 2018-09-08: qty 30

## 2018-09-08 MED ORDER — PROMETHAZINE HCL 25 MG/ML IJ SOLN
12.5000 mg | Freq: Once | INTRAMUSCULAR | Status: AC
  Administered 2018-09-08: 12.5 mg via INTRAVENOUS
  Filled 2018-09-08: qty 1

## 2018-09-08 MED ORDER — IOHEXOL 350 MG/ML SOLN
100.0000 mL | Freq: Once | INTRAVENOUS | Status: AC | PRN
  Administered 2018-09-08: 20:00:00 52 mL via INTRAVENOUS

## 2018-09-08 MED ORDER — SODIUM CHLORIDE 0.45 % IV SOLN
INTRAVENOUS | Status: DC
  Administered 2018-09-08: 05:00:00 via INTRAVENOUS

## 2018-09-08 MED ORDER — KETOROLAC TROMETHAMINE 30 MG/ML IJ SOLN
30.0000 mg | INTRAMUSCULAR | Status: AC
  Administered 2018-09-08: 05:00:00 30 mg via INTRAVENOUS
  Filled 2018-09-08: qty 1

## 2018-09-08 MED ORDER — FOLIC ACID 1 MG PO TABS
1.0000 mg | ORAL_TABLET | Freq: Every day | ORAL | Status: DC
  Administered 2018-09-09: 1 mg via ORAL
  Filled 2018-09-08: qty 1

## 2018-09-08 MED ORDER — HEPARIN SOD (PORK) LOCK FLUSH 100 UNIT/ML IV SOLN
500.0000 [IU] | INTRAVENOUS | Status: AC | PRN
  Administered 2018-09-08: 11:00:00 500 [IU]

## 2018-09-08 MED ORDER — DIPHENHYDRAMINE HCL 50 MG/ML IJ SOLN
25.0000 mg | Freq: Once | INTRAMUSCULAR | Status: AC
  Administered 2018-09-08: 18:00:00 25 mg via INTRAVENOUS
  Filled 2018-09-08: qty 1

## 2018-09-08 MED ORDER — HYDROMORPHONE HCL 1 MG/ML IJ SOLN
2.0000 mg | Freq: Once | INTRAMUSCULAR | Status: AC | PRN
  Administered 2018-09-08: 20:00:00 2 mg via INTRAVENOUS
  Filled 2018-09-08: qty 2

## 2018-09-08 MED ORDER — SODIUM CHLORIDE 0.45 % IV SOLN
INTRAVENOUS | Status: DC
  Administered 2018-09-08: 18:00:00 via INTRAVENOUS

## 2018-09-08 NOTE — Discharge Instructions (Addendum)
Please see the information and instructions below regarding your visit.  Your diagnoses today include:  1. Sickle cell pain crisis (HCC)   2. Sickle cell crisis (HCC)   3. Leukocytosis, unspecified type   4. Bilateral leg pain     Tests performed today include: See side panel of your discharge paperwork for testing performed today. Vital signs are listed at the bottom of these instructions.   Your hemoglobin is at baseline at 7.8 today.   Medications prescribed:    Take any prescribed medications only as prescribed, and any over the counter medications only as directed on the packaging.  Please continue your home medication regimen.  Home care instructions:  Please follow any educational materials contained in this packet.   Follow-up instructions: Please follow-up with your primary care provider in  for further evaluation of your symptoms if they are not completely improved.   Return instructions:  Please return to the Emergency Department if you experience worsening symptoms.  Please come back to the emergency department he develop any worsening pain, shortness of breath, chest pain, cough, or fever.  Please return if you have any other emergent concerns.  Additional Information:   Your vital signs today were: BP 106/70    Pulse 75    Temp 98.3 F (36.8 C) (Oral)    Resp 12    Wt 72.3 kg    LMP 11/15/2017 (Exact Date)    SpO2 96%    BMI 32.19 kg/m  If your blood pressure (BP) was elevated on multiple readings during this visit above 130 for the top number or above 80 for the bottom number, please have this repeated by your primary care provider within one month. --------------  Thank you for allowing [place] to participate in your care today.

## 2018-09-08 NOTE — H&P (Signed)
History and Physical    Joanna [name] ZOX:[id number] DOB: 31-Oct-1980 DOA: 09/08/2018  PCHillary Bow: Patient, No Pcp Per  Patient coming from: Home.  Chief Complaint: Lower extremity pain.  HPI: Joanna [name] is a 38 y.o. female with history of sickle cell anemia asthma presents to the ER with complaints of increasing pain in the both lower extremities over the last 3 days.  Has been having some productive cough for last 1 week.  Denies any shortness of breath fever chills chest pain headache or any visual symptoms.  Has been taking methadone for chronic pain along with as needed oxycodone.  Has stopped taking hydroxyurea many months ago.  ED Course: In the ER patient was found to be hypoxic requiring 2 L to maintain sats.  CT angiogram of the chest was done which was negative for PE but which is concerning for right heart failure was seen in the CAT scan.  On exam patient has mild edema of the both lower extremity.  Has been having some productive cough but no wheezing on exam.  Patient admitted for sickle cell pain crisis and also further work-up of hypoxia.  Patient's labs show hemoglobin to be at baseline.  Patient's WBC count is 23.5.  Increased from 19 few hours ago.  Patient is afebrile.  COVID-19 was negative.  Review of Systems: As per HPI, rest all negative.   Past Medical History:  Diagnosis Date   Asthma    Sickle cell anemia (HCC)     History reviewed. No pertinent surgical history.   reports that she has been smoking. She has been smoking about 1.00 pack per day. She uses smokeless tobacco. She reports previous alcohol use. She reports previous drug use.  Allergies  Allergen Reactions   Ondansetron Hives, Itching and Rash        Metoclopramide Hives, Itching and Rash    Family History  Problem Relation Age of Onset   Diabetes Father     Prior to Admission medications   Medication Sig Start Date End Date Taking? Authorizing Provider  albuterol (PROVENTIL HFA) 108 (90 Base)  MCG/ACT inhaler Inhale 2 puffs into the lungs every 6 (six) hours as needed for wheezing.  12/20/13   [provider]  DULoxetine (CYMBALTA) 30 MG capsule Take 30-60 mg by mouth See admin instructions. Take 2 capsules (60mg ) in the morning and 1 capsule (30mg ) in the evening. 09/04/17   [provider]  fluticasone (FLONASE) 50 MCG/ACT nasal spray Place 1 spray into the nose daily as needed for allergies.     [provider]  folic acid (FOLVITE) 1 MG tablet Take 1 mg by mouth daily.    [provider]  gabapentin (NEURONTIN) 100 MG capsule Take 100 mg by mouth 3 (three) times daily. 08/17/17   [provider]  hydroxyurea (HYDREA) 100 mg/mL SUSP Take 1,000 mg by mouth daily.     [provider]  methadone (DOLOPHINE) 5 MG tablet Take 5 mg by mouth every 12 (twelve) hours.    [provider]  naloxone [facility](NARCAN) nasal spray 4 mg/0.1 mL Place 1 spray into the nose once as needed (opiod overdose).  11/21/14   [provider]  Oxycodone HCl 10 MG TABS Take 10 mg by mouth every 4 (four) hours as needed (pain).     [provider]  promethazine (PHENERGAN) 25 MG tablet Take 1 tablet (25 mg total) by mouth every 6 (six) hours as needed for nausea or vomiting. 11/26/17  Muthersbaugh, Dahlia Client, PA-C  risperiDONE (RISPERDAL) 2 MG tablet Take 2 mg by mouth 2 (two) times daily. 10/03/17   [provider]    Physical Exam: Vitals:   09/08/18 2054 09/08/18 2100 09/08/18 2130 09/08/18 2200  BP: 122/86 102/69 105/70 105/68  Pulse: 83   75  Resp: 12 10 (!) 8 (!) 8  Temp:      TempSrc:      SpO2: 99%   94%  Weight:      Height:          Constitutional: Moderately built and nourished. Vitals:   09/08/18 2054 09/08/18 2100 09/08/18 2130 09/08/18 2200  BP: 122/86 102/69 105/70 105/68  Pulse: 83   75  Resp: 12 10 (!) 8 (!) 8  Temp:      TempSrc:      SpO2: 99%   94%  Weight:      Height:       Eyes: Anicteric no  pallor. ENMT: No discharge from the ears eyes nose or mouth. Neck: No mass or.  No neck rigidity.  No JVD appreciated. Respiratory: No rhonchi or crepitations. Cardiovascular: S1-S2 heard. Abdomen: Soft nontender bowel sounds present. Musculoskeletal: Mild edema both lower extremity. Skin: No rash. Neurologic: Alert awake oriented to time place and person.  Moves all extremities. Psychiatric: Appears normal.   Labs on Admission: I have personally reviewed following labs and imaging studies  CBC: Recent Labs  Lab 09/08/18 0519 09/08/18 1812  WBC 19.6* 23.5*  NEUTROABS 13.4* 18.5*  HGB 7.8* 8.0*  HCT 21.8* 25.0*  MCV 91.6 96.2  PLT 428* 540*   Basic Metabolic Panel: Recent Labs  Lab 09/08/18 0519 09/08/18 1812  NA 137 137  K 4.1 3.9  CL 106 107  CO2 22 21*  GLUCOSE 98 101*  BUN 7 8  CREATININE 0.57 0.55  CALCIUM 8.5* 8.6*   GFR: Estimated Creatinine Clearance: 83.9 mL/min (by C-G formula based on SCr of 0.55 mg/dL). Liver Function Tests: Recent Labs  Lab 09/08/18 0519  AST 23  ALT 12  ALKPHOS 133*  BILITOT 2.5*  PROT 6.2*  ALBUMIN 3.6   No results for input(s): LIPASE, AMYLASE in the last 168 hours. No results for input(s): AMMONIA in the last 168 hours. Coagulation Profile: No results for input(s): INR, PROTIME in the last 168 hours. Cardiac Enzymes: No results for input(s): CKTOTAL, CKMB, CKMBINDEX, TROPONINI in the last 168 hours. BNP (last 3 results) No results for input(s): PROBNP in the last 8760 hours. HbA1C: No results for input(s): HGBA1C in the last 72 hours. CBG: No results for input(s): GLUCAP in the last 168 hours. Lipid Profile: No results for input(s): CHOL, HDL, LDLCALC, TRIG, CHOLHDL, LDLDIRECT in the last 72 hours. Thyroid Function Tests: No results for input(s): TSH, T4TOTAL, FREET4, T3FREE, THYROIDAB in the last 72 hours. Anemia Panel: Recent Labs    09/08/18 0519 09/08/18 1812  RETICCTPCT 34.7* 36.7*   Urine analysis:     Component Value Date/Time   COLORURINE YELLOW 11/26/2017 0437   APPEARANCEUR CLEAR 11/26/2017 0437   LABSPEC 1.012 11/26/2017 0437   PHURINE 6.0 11/26/2017 0437   GLUCOSEU NEGATIVE 11/26/2017 0437   HGBUR NEGATIVE 11/26/2017 0437   BILIRUBINUR NEGATIVE 11/26/2017 0437   KETONESUR NEGATIVE 11/26/2017 0437   PROTEINUR 100 (A) 11/26/2017 0437   NITRITE NEGATIVE 11/26/2017 0437   LEUKOCYTESUR NEGATIVE 11/26/2017 0437   Sepsis Labs: (procalcitonin:4,lacticidven:4) ) Recent Results (from the past 240 hour(s))  SARS Coronavirus 2 (CEPHEID - Performed  in [facility] hospital lab), Hosp Order     Status: None   Collection Time: 09/08/18  7:50 PM  Result Value Ref Range Status   SARS Coronavirus 2 NEGATIVE NEGATIVE Final    Comment: (NOTE) If result is NEGATIVE SARS-CoV-2 target nucleic acids are NOT DETECTED. The SARS-CoV-2 RNA is generally detectable in upper and lower  respiratory specimens during the acute phase of infection. The lowest  concentration of SARS-CoV-2 viral copies this assay can detect is 250  copies / mL. A negative result does not preclude SARS-CoV-2 infection  and should not be used as the sole basis for treatment or other  patient management decisions.  A negative result may occur with  improper specimen collection / handling, submission of specimen other  than nasopharyngeal swab, presence of viral mutation(s) within the  areas targeted by this assay, and inadequate number of viral copies  (<250 copies / mL). A negative result must be combined with clinical  observations, patient history, and epidemiological information. If result is POSITIVE SARS-CoV-2 target nucleic acids are DETECTED. The SARS-CoV-2 RNA is generally detectable in upper and lower  respiratory specimens dur ing the acute phase of infection.  Positive  results are indicative of active infection with SARS-CoV-2.  Clinical  correlation with patient history and other diagnostic  information is  necessary to determine patient infection status.  Positive results do  not rule out bacterial infection or co-infection with other viruses. If result is PRESUMPTIVE POSTIVE SARS-CoV-2 nucleic acids MAY BE PRESENT.   A presumptive positive result was obtained on the submitted specimen  and confirmed on repeat testing.  While 2019 novel coronavirus  (SARS-CoV-2) nucleic acids may be present in the submitted sample  additional confirmatory testing may be necessary for epidemiological  and / or clinical management purposes  to differentiate between  SARS-CoV-2 and other Sarbecovirus currently known to infect humans.  If clinically indicated additional testing with an alternate test  methodology [id number]) is advised. The SARS-CoV-2 RNA is generally  detectable in upper and lower respiratory sp ecimens during the acute  phase of infection. The expected result is Negative. Fact Sheet for Patients:  BoilerBrush.com.cy Fact Sheet for Healthcare Providers: https://pope.com/ This test is not yet approved or cleared by the [place] FDA and has been authorized for detection and/or diagnosis of SARS-CoV-2 by FDA under an Emergency Use Authorization (EUA).  This EUA will remain in effect (meaning this test can be used) for the duration of the COVID-19 declaration under Section 564(b)(1) of the Act, 21 U.S.C. section 360bbb-3(b)(1), unless the authorization is terminated or revoked sooner. Performed at [facility], [address], [place], [place] [address]      Radiological Exams on Admission: Ct Angio Chest Pe W And/or Wo Contrast  Result Date: 09/08/2018 CLINICAL DATA:  Sickle cell anemia. Hypoxia. EXAM: CT ANGIOGRAPHY CHEST WITH CONTRAST TECHNIQUE: Multidetector CT imaging of the chest was performed using the standard protocol during bolus administration of intravenous contrast. Multiplanar CT image reconstructions and  MIPs were obtained to evaluate the vascular anatomy. CONTRAST:  56mL OMNIPAQUE IOHEXOL 350 MG/ML SOLN COMPARISON:  Chest radiograph from earlier today. FINDINGS: Cardiovascular: The study is moderate to high quality for the evaluation of pulmonary embolism, with slightly suboptimal bolus opacification. There are no filling defects in the central, lobar, segmental or subsegmental pulmonary artery branches to suggest acute pulmonary embolism. Normal course and caliber of the thoracic aorta. Dilated main pulmonary artery (4.1 cm diameter). Mild cardiomegaly. No significant  pericardial fluid/thickening. Left internal jugular Port-A-Cath terminates at the cavoatrial junction. Right internal jugular Port-A-Cath terminates at the cavoatrial junction. Mediastinum/Nodes: No discrete thyroid nodules. Unremarkable esophagus. No axillary adenopathy. Enlarged 1.7 cm right paratracheal node (series 6/image 35). Mild bilateral hilar adenopathy, largest 1.0 cm on the right (series 6/image 41) and 1.0 cm on the left (series 6/image 49). Lungs/Pleura: No pneumothorax. No pleural effusion. No acute consolidative airspace disease, lung masses or significant pulmonary nodules. Mosaic attenuation throughout both lungs. Scattered parenchymal bands at both lung bases compatible with mild scarring or atelectasis. Upper abdomen: Contrast reflux into IVC and hepatic veins. Autosplenectomy. Musculoskeletal: No aggressive appearing focal osseous lesions. Minimal endplate concavity in the midthoracic vertebral bodies. Review of the MIP images confirms the above findings. IMPRESSION: 1. No evidence of pulmonary embolism. 2. Prominently dilated main pulmonary artery, compatible with pulmonary arterial hypertension. 3. Cardiomegaly. Contrast reflux into the IVC and hepatic veins, suggesting right heart failure. 4. Mosaic attenuation throughout both lungs, nonspecific, probably due to mosaic perfusion from pulmonary vascular disease. 5. Mild  mediastinal and bilateral hilar lymphadenopathy, nonspecific, most likely reactive. Electronically Signed   By: Delbert Phenix M.D.   On: 09/08/2018 20:37   Dg Chest Port 1 View  Result Date: 09/08/2018 CLINICAL DATA:  Sickle cell crisis with back pain. EXAM: PORTABLE CHEST 1 VIEW COMPARISON:  11/19/2017 FINDINGS: Cardiomegaly that is chronic. There is diffuse interstitial opacity that was also seen previously. The hila are congested. No pleural effusion noted. No pneumothorax. Bilateral porta catheter with tips at the upper cavoatrial junction. IMPRESSION: Cardiomegaly and pulmonary opacity that is stable from 11/19/2017. The opacity could be from scarring or recurrent pulmonary edema. Electronically Signed   By: Marnee Spring M.D.   On: 09/08/2018 04:46   Vas [place] Lower Extremity Venous (dvt) (only [facility])  Result Date: 09/08/2018  Lower Venous Study Indications: Edema.  Performing Technologist: Blanch Media RVS  Examination Guidelines: A complete evaluation includes B-mode imaging, spectral Doppler, color Doppler, and power Doppler as needed of all accessible portions of each vessel. Bilateral testing is considered an integral part of a complete examination. Limited examinations for reoccurring indications may be performed as noted.  +---------+---------------+---------+-----------+----------+--------------+  RIGHT     Compressibility Phasicity Spontaneity Properties Summary         +---------+---------------+---------+-----------+----------+--------------+  CFV       Full            Yes       Yes                                    +---------+---------------+---------+-----------+----------+--------------+  SFJ       Full                                                             +---------+---------------+---------+-----------+----------+--------------+  FV Prox   Full                                                             +---------+---------------+---------+-----------+----------+--------------+  FV Mid    Full                                                             +---------+---------------+---------+-----------+----------+--------------+  FV Distal Full                                                             +---------+---------------+---------+-----------+----------+--------------+  PFV       Full                                                             +---------+---------------+---------+-----------+----------+--------------+  POP       Full            Yes       Yes                                    +---------+---------------+---------+-----------+----------+--------------+  PTV       Full                                                             +---------+---------------+---------+-----------+----------+--------------+  PERO                                                       Not visualized  +---------+---------------+---------+-----------+----------+--------------+   +----+---------------+---------+-----------+----------+-------+  LEFT Compressibility Phasicity Spontaneity Properties Summary  +----+---------------+---------+-----------+----------+-------+  CFV  Full            Yes       Yes                             +----+---------------+---------+-----------+----------+-------+     Summary: Right: There is no evidence of deep vein thrombosis in the lower extremity. No cystic structure found in the popliteal fossa. Left: No evidence of common femoral vein obstruction.  *See table(s) above for measurements and observations. Electronically signed by Coral Else MD on 09/08/2018 at 1:09:19 PM.    Final     EKG: Independently reviewed.  Normal sinus rhythm.  Assessment/Plan Principal Problem:   Sickle cell pain crisis (HCC)    1. Sickle cell pain crisis -patient was mildly lethargic initially so I have started her on PCA which is not weightbase.  We will continue patient's methadone.  Repeat CBC LDH. 2. Hypoxia with lower extremity edema with CAT scan showing features  concerning for right heart failure.  Will check BNP 2D echo Dopplers of the lower  extremity.  Patient also has some productive cough but no wheezing.  Closely observe. 3. Leukocytosis -no fever at this time.  Closely observe.  Will check UA. 4. Anemia secondary to sickle cell disease appear to be at baseline follow CBC. 5. Tobacco abuse -advised about quitting. 6. Bilateral hilar and mediastinal adenopathy likely reactive.  Will need follow-up as outpatient.  Patient does not want to move to [facility] long hospital.  Admitted to [facility].   DVT prophylaxis: Lovenox. Code Status: Full code. Family Communication: Discussed with patient. Disposition Plan: Home. Consults called: None. Admission status: Inpatient.   Eduard Clos MD [facility] Pager [phone].  If 7PM-7AM, please contact night-coverage [email or web address] Password [facility]  09/08/2018, 11:43 PM

## 2018-09-08 NOTE — ED Notes (Signed)
ED TO INPATIENT HANDOFF REPORT  ED Nurse Name and Phone #: Gabriel Rung, RN [phone]  S Name/Age/Gender Joanna [name] 38 y.o. female Room/Bed: 053C/053C  Code Status   Code Status: Full Code  Home/SNF/Other Home Patient oriented to: self, place, time and situation Is this baseline? Yes   Triage Complete: Triage complete  Chief Complaint sickle cell pain   Triage Note Pt here with c.o sickle cell pain crisis, having pain in bilateral legs, was seen here last night for the same but states the pain is back. Pt a.o, nad noted.   Allergies Allergies  Allergen Reactions  . Ondansetron Hives, Itching and Rash       . Metoclopramide Hives, Itching and Rash    Level of Care/Admitting Diagnosis ED Disposition    ED Disposition Condition Comment   Admit  Hospital Area: [facility] [[id number]]  Level of Care: Telemetry Medical [104]  Covid Evaluation: N/A  Diagnosis: Sickle cell pain crisis [facility]) [[id number]]  Admitting Physician: Eduard Clos [phone]  Attending Physician: Eduard Clos [phone]  Estimated length of stay: past midnight tomorrow  Certification:: I certify this patient will need inpatient services for at least 2 midnights  PT Class (Do Not Modify): Inpatient [101]  PT Acc Code (Do Not Modify): Private [1]       B Medical/Surgery History Past Medical History:  Diagnosis Date  . Asthma   . Sickle cell anemia (HCC)    History reviewed. No pertinent surgical history.   A IV Location/Drains/Wounds Patient Lines/Drains/Airways Status   Active Line/Drains/Airways    Name:   Placement date:   Placement time:   Site:   Days:   Implanted Port 08/27/18 Left Chest   08/27/18    0524    Chest   12          Intake/Output Last 24 hours No intake or output data in the 24 hours ending 09/08/18 2349  Labs/Imaging Results for orders placed or performed during the hospital encounter of 09/08/18 (from the past 48 hour(s))  CBC with  Differential     Status: Abnormal   Collection Time: 09/08/18  6:12 PM  Result Value Ref Range   WBC 23.5 (H) 4.0 - 10.5 K/uL   RBC 2.60 (L) 3.87 - 5.11 MIL/uL   Hemoglobin 8.0 (L) 12.0 - 15.0 g/dL   HCT 89.3 (L) 81.0 - 17.5 %   MCV 96.2 80.0 - 100.0 fL   MCH 30.8 26.0 - 34.0 pg   MCHC 32.0 30.0 - 36.0 g/dL   RDW 10.2 (H) 58.5 - 27.7 %   Platelets 540 (H) 150 - 400 K/uL   nRBC 6.4 (H) 0.0 - 0.2 %   Neutrophils Relative % 79 %   Neutro Abs 18.5 (H) 1.7 - 7.7 K/uL   Lymphocytes Relative 13 %   Lymphs Abs 3.0 0.7 - 4.0 K/uL   Monocytes Relative 6 %   Monocytes Absolute 1.5 (H) 0.1 - 1.0 K/uL   Eosinophils Relative 1 %   Eosinophils Absolute 0.0 0.0 - 0.5 K/uL   Basophils Relative 0 %   Basophils Absolute 0.0 0.0 - 0.1 K/uL   Immature Granulocytes 1 %   Abs Immature Granulocytes 0.20 (H) 0.00 - 0.07 K/uL    Comment: Performed at [facility], [address], [place], [place] [address]  Basic metabolic panel     Status: Abnormal   Collection Time: 09/08/18  6:12 PM  Result Value Ref Range   Sodium  137 135 - 145 mmol/L   Potassium 3.9 3.5 - 5.1 mmol/L   Chloride 107 98 - 111 mmol/L   CO2 21 (L) 22 - 32 mmol/L   Glucose, Bld 101 (H) 70 - 99 mg/dL   BUN 8 6 - 20 mg/dL   Creatinine, Ser 4.54 0.44 - 1.00 mg/dL   Calcium 8.6 (L) 8.9 - 10.3 mg/dL   GFR calc non Af Amer >60 >60 mL/min   GFR calc Af Amer >60 >60 mL/min   Anion gap 9 5 - 15    Comment: Performed at [facility], [address] [place], [place] [address]  Reticulocytes     Status: Abnormal   Collection Time: 09/08/18  6:12 PM  Result Value Ref Range   Retic Ct Pct 36.7 (H) 0.4 - 3.1 %   RBC. 2.60 (L) 3.87 - 5.11 MIL/uL   Retic Count, Absolute 953.0 (H) 19.0 - 186.0 K/uL   Immature Retic Fract 24.4 (H) 2.3 - 15.9 %    Comment: Performed at [facility], [address], [place], [place] [address]  SARS Coronavirus 2 (CEPHEID - Performed in [facility] hospital lab), Hosp Order     Status: None    Collection Time: 09/08/18  7:50 PM  Result Value Ref Range   SARS Coronavirus 2 NEGATIVE NEGATIVE    Comment: (NOTE) If result is NEGATIVE SARS-CoV-2 target nucleic acids are NOT DETECTED. The SARS-CoV-2 RNA is generally detectable in upper and lower  respiratory specimens during the acute phase of infection. The lowest  concentration of SARS-CoV-2 viral copies this assay can detect is 250  copies / mL. A negative result does not preclude SARS-CoV-2 infection  and should not be used as the sole basis for treatment or other  patient management decisions.  A negative result may occur with  improper specimen collection / handling, submission of specimen other  than nasopharyngeal swab, presence of viral mutation(s) within the  areas targeted by this assay, and inadequate number of viral copies  (<250 copies / mL). A negative result must be combined with clinical  observations, patient history, and epidemiological information. If result is POSITIVE SARS-CoV-2 target nucleic acids are DETECTED. The SARS-CoV-2 RNA is generally detectable in upper and lower  respiratory specimens dur ing the acute phase of infection.  Positive  results are indicative of active infection with SARS-CoV-2.  Clinical  correlation with patient history and other diagnostic information is  necessary to determine patient infection status.  Positive results do  not rule out bacterial infection or co-infection with other viruses. If result is PRESUMPTIVE POSTIVE SARS-CoV-2 nucleic acids MAY BE PRESENT.   A presumptive positive result was obtained on the submitted specimen  and confirmed on repeat testing.  While 2019 novel coronavirus  (SARS-CoV-2) nucleic acids may be present in the submitted sample  additional confirmatory testing may be necessary for epidemiological  and / or clinical management purposes  to differentiate between  SARS-CoV-2 and other Sarbecovirus currently known to infect humans.  If clinically  indicated additional testing with an alternate test  methodology [phone]) is advised. The SARS-CoV-2 RNA is generally  detectable in upper and lower respiratory sp ecimens during the acute  phase of infection. The expected result is Negative. Fact Sheet for Patients:  BoilerBrush.com.cy Fact Sheet for Healthcare Providers: https://pope.com/ This test is not yet approved or cleared by the [place] FDA and has been authorized for detection and/or diagnosis of SARS-CoV-2 by FDA under an  Emergency Use Authorization (EUA).  This EUA will remain in effect (meaning this test can be used) for the duration of the COVID-19 declaration under Section 564(b)(1) of the Act, 21 U.S.C. section 360bbb-3(b)(1), unless the authorization is terminated or revoked sooner. Performed at [facility], [address] [place], [place] [address]    Ct Angio Chest Pe W And/or Wo Contrast  Result Date: 09/08/2018 CLINICAL DATA:  Sickle cell anemia. Hypoxia. EXAM: CT ANGIOGRAPHY CHEST WITH CONTRAST TECHNIQUE: Multidetector CT imaging of the chest was performed using the standard protocol during bolus administration of intravenous contrast. Multiplanar CT image reconstructions and MIPs were obtained to evaluate the vascular anatomy. CONTRAST:  52mL OMNIPAQUE IOHEXOL 350 MG/ML SOLN COMPARISON:  Chest radiograph from earlier today. FINDINGS: Cardiovascular: The study is moderate to high quality for the evaluation of pulmonary embolism, with slightly suboptimal bolus opacification. There are no filling defects in the central, lobar, segmental or subsegmental pulmonary artery branches to suggest acute pulmonary embolism. Normal course and caliber of the thoracic aorta. Dilated main pulmonary artery (4.1 cm diameter). Mild cardiomegaly. No significant pericardial fluid/thickening. Left internal jugular Port-A-Cath terminates at the cavoatrial junction. Right internal  jugular Port-A-Cath terminates at the cavoatrial junction. Mediastinum/Nodes: No discrete thyroid nodules. Unremarkable esophagus. No axillary adenopathy. Enlarged 1.7 cm right paratracheal node (series 6/image 35). Mild bilateral hilar adenopathy, largest 1.0 cm on the right (series 6/image 41) and 1.0 cm on the left (series 6/image 49). Lungs/Pleura: No pneumothorax. No pleural effusion. No acute consolidative airspace disease, lung masses or significant pulmonary nodules. Mosaic attenuation throughout both lungs. Scattered parenchymal bands at both lung bases compatible with mild scarring or atelectasis. Upper abdomen: Contrast reflux into IVC and hepatic veins. Autosplenectomy. Musculoskeletal: No aggressive appearing focal osseous lesions. Minimal endplate concavity in the midthoracic vertebral bodies. Review of the MIP images confirms the above findings. IMPRESSION: 1. No evidence of pulmonary embolism. 2. Prominently dilated main pulmonary artery, compatible with pulmonary arterial hypertension. 3. Cardiomegaly. Contrast reflux into the IVC and hepatic veins, suggesting right heart failure. 4. Mosaic attenuation throughout both lungs, nonspecific, probably due to mosaic perfusion from pulmonary vascular disease. 5. Mild mediastinal and bilateral hilar lymphadenopathy, nonspecific, most likely reactive. Electronically Signed   By: Delbert PhenixJason A Poff M.D.   On: 09/08/2018 20:37   Dg Chest Port 1 View  Result Date: 09/08/2018 CLINICAL DATA:  Sickle cell crisis with back pain. EXAM: PORTABLE CHEST 1 VIEW COMPARISON:  11/19/2017 FINDINGS: Cardiomegaly that is chronic. There is diffuse interstitial opacity that was also seen previously. The hila are congested. No pleural effusion noted. No pneumothorax. Bilateral porta catheter with tips at the upper cavoatrial junction. IMPRESSION: Cardiomegaly and pulmonary opacity that is stable from 11/19/2017. The opacity could be from scarring or recurrent pulmonary edema.  Electronically Signed   By: Marnee SpringJonathon  Watts M.D.   On: 09/08/2018 04:46   Vas [place] Lower Extremity Venous (dvt) (only [facility])  Result Date: 09/08/2018  Lower Venous Study Indications: Edema.  Performing Technologist: Blanch MediaMegan Riddle RVS  Examination Guidelines: A complete evaluation includes B-mode imaging, spectral Doppler, color Doppler, and power Doppler as needed of all accessible portions of each vessel. Bilateral testing is considered an integral part of a complete examination. Limited examinations for reoccurring indications may be performed as noted.  +---------+---------------+---------+-----------+----------+--------------+ RIGHT    CompressibilityPhasicitySpontaneityPropertiesSummary        +---------+---------------+---------+-----------+----------+--------------+ CFV      Full           Yes  Yes                                 +---------+---------------+---------+-----------+----------+--------------+ SFJ      Full                                                        +---------+---------------+---------+-----------+----------+--------------+ FV Prox  Full                                                        +---------+---------------+---------+-----------+----------+--------------+ FV Mid   Full                                                        +---------+---------------+---------+-----------+----------+--------------+ FV DistalFull                                                        +---------+---------------+---------+-----------+----------+--------------+ PFV      Full                                                        +---------+---------------+---------+-----------+----------+--------------+ POP      Full           Yes      Yes                                 +---------+---------------+---------+-----------+----------+--------------+ PTV      Full                                                         +---------+---------------+---------+-----------+----------+--------------+ PERO                                                  Not visualized +---------+---------------+---------+-----------+----------+--------------+   +----+---------------+---------+-----------+----------+-------+ LEFTCompressibilityPhasicitySpontaneityPropertiesSummary +----+---------------+---------+-----------+----------+-------+ CFV Full           Yes      Yes                          +----+---------------+---------+-----------+----------+-------+     Summary: Right: There is no evidence of deep vein thrombosis in the lower extremity. No cystic structure found in the popliteal fossa. Left: No evidence of common femoral vein obstruction.  *See table(s)  above for measurements and observations. Electronically signed by Coral Else MD on 09/08/2018 at 1:09:19 PM.    Final     Pending Labs Unresulted Labs (From admission, onward)    Start     Ordered   09/15/18 0500  Creatinine, serum  (enoxaparin (LOVENOX)    CrCl >/= 30 ml/min)  Weekly,   R    Comments:  while on enoxaparin therapy    09/08/18 2342   09/09/18 0500  Lactate dehydrogenase  Tomorrow morning,   R     09/08/18 2342   09/09/18 0500  Comprehensive metabolic panel  Tomorrow morning,   R     09/08/18 2342   09/09/18 0500  CBC with Differential/Platelet  Tomorrow morning,   R     09/08/18 2342   09/08/18 2344  Urine rapid drug screen (hosp performed)  ONCE - STAT,   R     09/08/18 2344   09/08/18 2338  CBC  (enoxaparin (LOVENOX)    CrCl >/= 30 ml/min)  Once,   R    Comments:  Baseline for enoxaparin therapy IF NOT ALREADY DRAWN.  Notify MD if PLT < 100 K.    09/08/18 2342   09/08/18 2338  Creatinine, serum  (enoxaparin (LOVENOX)    CrCl >/= 30 ml/min)  Once,   R    Comments:  Baseline for enoxaparin therapy IF NOT ALREADY DRAWN.    09/08/18 2342   Signed and Held  Brain natriuretic peptide  Once,   R     Signed and Held   Signed and Held   Troponin I - Once  Once,   R     Signed and Held          Vitals/Pain Today's Vitals   09/08/18 2055 09/08/18 2100 09/08/18 2130 09/08/18 2200  BP:  102/69 105/70 105/68  Pulse:    75  Resp:  10 (!) 8 (!) 8  Temp:      TempSrc:      SpO2:    94%  Weight:      Height:      PainSc: Asleep       Isolation Precautions No active isolations  Medications Medications  methadone (DOLOPHINE) tablet 5 mg (has no administration in time range)  folic acid (FOLVITE) tablet 1 mg (has no administration in time range)  albuterol (PROVENTIL) (2.5 MG/3ML) 0.083% nebulizer solution 2.5 mg (has no administration in time range)  senna-docusate (Senokot-S) tablet 1 tablet (has no administration in time range)  polyethylene glycol (MIRALAX / GLYCOLAX) packet 17 g (has no administration in time range)  enoxaparin (LOVENOX) injection 40 mg (has no administration in time range)  ketorolac (TORADOL) 15 MG/ML injection 15 mg (has no administration in time range)  naloxone (NARCAN) injection 0.4 mg (has no administration in time range)    And  sodium chloride flush (NS) 0.9 % injection 9 mL (has no administration in time range)  HYDROmorphone (DILAUDID) 1 mg/mL PCA injection (has no administration in time range)  HYDROmorphone (DILAUDID) injection 2 mg (2 mg Intravenous Given 09/08/18 1816)  ketorolac (TORADOL) 15 MG/ML injection 15 mg (15 mg Intravenous Given 09/08/18 1813)  diphenhydrAMINE (BENADRYL) injection 25 mg (25 mg Intravenous Given 09/08/18 1815)  HYDROmorphone (DILAUDID) injection 2 mg (2 mg Intravenous Given 09/08/18 1906)  HYDROmorphone (DILAUDID) injection 2 mg (2 mg Intravenous Given 09/08/18 1955)  promethazine (PHENERGAN) injection 12.5 mg (12.5 mg Intravenous Given 09/08/18 1951)  iohexol (OMNIPAQUE) 350 MG/ML injection 100 mL (  52 mLs Intravenous Contrast Given 09/08/18 2013)    Mobility walks Low fall risk   Focused Assessments Cardiac Assessment Handoff:    Lab Results   Component Value Date   TROPONINI <0.03 10/20/2017   No results found for: DDIMER Does the Patient currently have chest pain? No     R Recommendations: See Admitting Provider Note  Report given to:   Additional Notes:  Patient has been running in 80s on RA after pain meds given; Pt is currently on 4L at 97%; Pt is A&O x 4 and states her 02 "normally" runs low; Pt had bed at [facility] but refused to be transferred there; MD  aware of hypoxia concerns which is the main reason for admitting-Monique,RN

## 2018-09-08 NOTE — ED Notes (Signed)
ED Provider at bedside. 

## 2018-09-08 NOTE — Progress Notes (Signed)
Lower extremity venous has been completed.   Preliminary results in CV Proc.   Blanch Media 09/08/2018 10:07 AM

## 2018-09-08 NOTE — ED Provider Notes (Signed)
Physical Exam  BP 109/79   Pulse 87   Temp 98.3 F (36.8 C) (Oral)   Resp 15   Wt 72.3 kg   LMP 11/15/2017 (Exact Date)   SpO2 95%   BMI 32.19 kg/m   Assumed care from Rhea Bleacher, PA-C at 7:06 AM. Briefly, the patient is a 38 y.o. female with PMHx of  has a past medical history of Asthma and Sickle cell anemia (HCC). here with bilateral leg pain and pain that patient describes as her typical sickle cell pain.   Labs Reviewed  COMPREHENSIVE METABOLIC PANEL - Abnormal; Notable for the following components:      Result Value   Calcium 8.5 (*)    Total Protein 6.2 (*)    Alkaline Phosphatase 133 (*)    Total Bilirubin 2.5 (*)    All other components within normal limits  CBC WITH DIFFERENTIAL/PLATELET - Abnormal; Notable for the following components:   Platelets 428 (*)    All other components within normal limits  RETICULOCYTES  I-STAT BETA HCG BLOOD, ED ([facility], [facility], [facility] ONLY)    Course of Care:   Physical Exam Vitals signs and nursing note reviewed.  Constitutional:      General: She is not in acute distress.    Appearance: She is well-developed. She is not diaphoretic.     Comments: Sitting comfortably in bed.  HENT:     Head: Normocephalic and atraumatic.  Eyes:     General:        Right eye: No discharge.        Left eye: No discharge.     Conjunctiva/sclera: Conjunctivae normal.     Comments: EOMs normal to gross examination.  Neck:     Musculoskeletal: Normal range of motion.  Cardiovascular:     Rate and Rhythm: Normal rate and regular rhythm.     Comments: Intact, 2+ DP pulses. Pulmonary:     Comments: Converses comfortably. Speaks in full sentences.  Abdominal:     General: There is no distension.  Musculoskeletal: Normal range of motion.     Comments: 1+ pitting LE edema on right; trace LE edema on left.   Skin:    General: Skin is warm and dry.  Neurological:     Mental Status: She is alert.     Comments: Cranial nerves intact to gross  observation. Patient moves extremities without difficulty.  Psychiatric:        Behavior: Behavior normal.        Thought Content: Thought content normal.        Judgment: Judgment normal.     ED Course/Procedures   Clinical Course as of Sep 07 1657  Wed Sep 08, 2018  1443 Assessed pt. Pt is sleeping. DVT study is pending.    [AM]  0841 Pt has history of leukocytosis with sickle cell exacerbations.   WBC(!): 19.6 [AM]  0842 At baseline.   Hemoglobin(!): 7.8 [AM]  0843 Patient has chronic thrombocytosis.   Platelets(!): 428 [AM]  0843 Consistent with prior SS crises.   Retic Ct Pct(!): 34.7 [AM]    Clinical Course User Index [AM] Elisha Ponder, PA-C    Procedures  MDM    At time of signout, DVT study result is pending.  If patient's pain is under control and her DVT study is negative, patient stable for discharge.  DVT study returned negative.  At conclusion of her emergency department course, she was resting comfortably no acute distress.  Pain adequately  controlled.  Patient is instructed to follow-up with her primary care provider as well as her hematologist for further workup of leg swelling.  She is given return precautions for any worsening pain, shortness of breath, chest pain, cough, or fever.  Patient is in understanding and agrees with the plan of care.     Elisha PonderMurray, Nayden Czajka B, PA-C 09/08/18 1703    Geoffery Lyonselo, Douglas, MD 09/09/18 [phone]

## 2018-09-08 NOTE — ED Notes (Signed)
Pt requesting for this RN to deaccess port so she can go home, this RN explained to the pt why she is being admitted and the importance of staying. Pt still wanting to go home. Admitting doctor paged.

## 2018-09-08 NOTE — ED Notes (Signed)
Vascular at bedside

## 2018-09-08 NOTE — ED Provider Notes (Cosign Needed)
[facility] EMERGENCY DEPARTMENT Provider Note   CSN: [id number] Arrival date & time: 09/08/18  1730    History   Chief Complaint Chief Complaint  Patient presents with   Sickle Cell Pain Crisis    HPI Asako Saliba is a 38 y.o. female.     The history is provided by the patient and medical records. No language interpreter was used.  Sickle Cell Pain Crisis  Associated symptoms: no chest pain    Liley Rake is a 38 y.o. female  with a PMH of sickle cell anemia who presents to the Emergency Department complaining of bilateral lower extremity pain and low back pain consistent with her typical sickle cell crises.  Her symptoms began 1 or 2 days ago.  She was seen in the emergency department early this morning for similar.  She states that when she left, she thought her pain would be manageable at home.  She very much wanted to get out of the hospital and did not want to be admitted.  She states that about 6 to 8 hours after being discharged, she started having pain again that was not relieved with her home medications.  She denies any fever, chest pain or shortness of breath.  Has noticed some swelling to the right lower extremity, but did have DVT study done of the right leg this morning.  No known sick contacts.  Past Medical History:  Diagnosis Date   Asthma    Sickle cell anemia (HCC)     Patient Active Problem List   Diagnosis Date Noted   Community acquired pneumonia    Sickle cell crisis (HCC) 09/28/2017   Sickle cell anemia with pain (HCC) 09/28/2017   Sickle cell pain crisis (HCC) 09/21/2017   Abdominal pain 09/21/2017   Tobacco abuse 09/21/2017    History reviewed. No pertinent surgical history.   OB History    Gravida  1   Para      Term      Preterm      AB      Living        SAB      TAB      Ectopic      Multiple      Live Births               Home Medications    Prior to Admission medications   Medication  Sig Start Date End Date Taking? Authorizing Provider  albuterol (PROVENTIL HFA) 108 (90 Base) MCG/ACT inhaler Inhale 2 puffs into the lungs every 6 (six) hours as needed for wheezing.  12/20/13   [provider]  DULoxetine (CYMBALTA) 30 MG capsule Take 30-60 mg by mouth See admin instructions. Take 2 capsules ( ) in the morning and 1 capsule ( ) in the evening. 09/04/17   [provider]  fluticasone (FLONASE) 50 MCG/ACT nasal spray Place 1 spray into the nose daily as needed for allergies.     [provider]  folic acid (FOLVITE) 1 MG tablet Take 1 mg by mouth daily.    [provider]  gabapentin (NEURONTIN) 100 MG capsule Take 100 mg by mouth 3 (three) times daily. 08/17/17   [provider]  hydroxyurea (HYDREA) 100 mg/mL SUSP Take 1,000 mg by mouth daily.     [provider]  methadone (DOLOPHINE) 5 MG tablet Take 5 mg by mouth every 12 (twelve) hours.    [provider]  naloxone [facility]) nasal spray 4 mg/0.1  mL Place 1 spray into the nose once as needed (opiod overdose).  11/21/14   [provider]  Oxycodone HCl 10 MG TABS Take 10 mg by mouth every 4 (four) hours as needed (pain).     [provider]  promethazine (PHENERGAN) 25 MG tablet Take 1 tablet (25 mg total) by mouth every 6 (six) hours as needed for nausea or vomiting. 11/26/17   Muthersbaugh, Dahlia ClientHannah, PA-C  risperiDONE (RISPERDAL) 2 MG tablet Take 2 mg by mouth 2 (two) times daily. 10/03/17   [provider]    Family History Family History  Problem Relation Age of Onset   Diabetes Father     Social History Social History   Tobacco Use   Smoking status: Current Every Day Smoker    Packs/day: 1.00   Smokeless tobacco: Current User  Substance Use Topics   Alcohol use: Not Currently    Comment: "every blue moon"   Drug use: Not Currently     Allergies   Ondansetron and Metoclopramide   Review of Systems Review of Systems    Cardiovascular: Positive for leg swelling. Negative for chest pain and palpitations.  Musculoskeletal: Positive for arthralgias and myalgias.  All other systems reviewed and are negative.    Physical Exam Updated Vital Signs BP 122/86    Pulse 83    Temp 98.3 F (36.8 C) (Oral)    Resp 12    Ht 4\' 11"  (1.499 m)    Wt 72 kg    LMP 11/15/2017 (Exact Date)    SpO2 99%    Breastfeeding Unknown    BMI 32.06 kg/m   Physical Exam Vitals signs and nursing note reviewed.  Constitutional:      General: She is not in acute distress.    Appearance: She is well-developed.  HENT:     Head: Normocephalic and atraumatic.  Neck:     Musculoskeletal: Neck supple.  Cardiovascular:     Rate and Rhythm: Normal rate and regular rhythm.     Heart sounds: Normal heart sounds. No murmur.  Pulmonary:     Effort: Pulmonary effort is normal. No respiratory distress.     Comments: Lungs are clear to auscultation bilaterally. Abdominal:     General: There is no distension.     Palpations: Abdomen is soft.     Tenderness: There is no abdominal tenderness.  Musculoskeletal:     Comments: No C/T/L-spine tenderness.  Generalized bilateral lower extremity tenderness.  No joint swelling appreciated.  No erythema.  Does have mild edema to the right lower extremity.  Skin:    General: Skin is warm and dry.  Neurological:     Mental Status: She is alert and oriented to person, place, and time.      ED Treatments / Results  Labs (all labs ordered are listed, but only abnormal results are displayed) Labs Reviewed  CBC WITH DIFFERENTIAL/PLATELET - Abnormal; Notable for the following components:      Result Value   WBC 23.5 (*)    RBC 2.60 (*)    Hemoglobin 8.0 (*)    HCT 25.0 (*)    RDW 25.1 (*)    Platelets 540 (*)    nRBC 6.4 (*)    Neutro Abs 18.5 (*)    Monocytes Absolute 1.5 (*)    Abs Immature Granulocytes 0.20 (*)    All other components within normal limits  BASIC METABOLIC PANEL -  Abnormal; Notable for the following components:   CO2 21 (*)  Glucose, Bld 101 (*)    Calcium 8.6 (*)    All other components within normal limits  RETICULOCYTES - Abnormal; Notable for the following components:   Retic Ct Pct 36.7 (*)    RBC. 2.60 (*)    Retic Count, Absolute 953.0 (*)    Immature Retic Fract 24.4 (*)    All other components within normal limits  SARS CORONAVIRUS 2 (HOSPITAL ORDER, PERFORMED IN [facility] LAB)    EKG EKG Interpretation  Date/Time:  Wednesday Sep 08 2018 17:50:28 EDT Ventricular Rate:  98 PR Interval:    QRS Duration: 106 QT Interval:  391 QTC Calculation: 500 R Axis:   28 Text Interpretation:  Sinus rhythm Low voltage, precordial leads RSR' in V1 or V2, right VCD or RVH Borderline T abnormalities, anterior leads Borderline prolonged QT interval Since last tracing rate faster Confirmed by Linwood Dibbles [phone]) on 09/08/2018 5:57:14 PM   Radiology Ct Angio Chest Pe W And/or Wo Contrast  Result Date: 09/08/2018 CLINICAL DATA:  Sickle cell anemia. Hypoxia. EXAM: CT ANGIOGRAPHY CHEST WITH CONTRAST TECHNIQUE: Multidetector CT imaging of the chest was performed using the standard protocol during bolus administration of intravenous contrast. Multiplanar CT image reconstructions and MIPs were obtained to evaluate the vascular anatomy. CONTRAST:  58mL OMNIPAQUE IOHEXOL 350 MG/ML SOLN COMPARISON:  Chest radiograph from earlier today. FINDINGS: Cardiovascular: The study is moderate to high quality for the evaluation of pulmonary embolism, with slightly suboptimal bolus opacification. There are no filling defects in the central, lobar, segmental or subsegmental pulmonary artery branches to suggest acute pulmonary embolism. Normal course and caliber of the thoracic aorta. Dilated main pulmonary artery (4.1 cm diameter). Mild cardiomegaly. No significant pericardial fluid/thickening. Left internal jugular Port-A-Cath terminates at the cavoatrial junction.  Right internal jugular Port-A-Cath terminates at the cavoatrial junction. Mediastinum/Nodes: No discrete thyroid nodules. Unremarkable esophagus. No axillary adenopathy. Enlarged 1.7 cm right paratracheal node (series 6/image 35). Mild bilateral hilar adenopathy, largest 1.0 cm on the right (series 6/image 41) and 1.0 cm on the left (series 6/image 49). Lungs/Pleura: No pneumothorax. No pleural effusion. No acute consolidative airspace disease, lung masses or significant pulmonary nodules. Mosaic attenuation throughout both lungs. Scattered parenchymal bands at both lung bases compatible with mild scarring or atelectasis. Upper abdomen: Contrast reflux into IVC and hepatic veins. Autosplenectomy. Musculoskeletal: No aggressive appearing focal osseous lesions. Minimal endplate concavity in the midthoracic vertebral bodies. Review of the MIP images confirms the above findings. IMPRESSION: 1. No evidence of pulmonary embolism. 2. Prominently dilated main pulmonary artery, compatible with pulmonary arterial hypertension. 3. Cardiomegaly. Contrast reflux into the IVC and hepatic veins, suggesting right heart failure. 4. Mosaic attenuation throughout both lungs, nonspecific, probably due to mosaic perfusion from pulmonary vascular disease. 5. Mild mediastinal and bilateral hilar lymphadenopathy, nonspecific, most likely reactive. Electronically Signed   By: Delbert Phenix M.D.   On: 09/08/2018 20:37   Dg Chest Port 1 View  Result Date: 09/08/2018 CLINICAL DATA:  Sickle cell crisis with back pain. EXAM: PORTABLE CHEST 1 VIEW COMPARISON:  11/19/2017 FINDINGS: Cardiomegaly that is chronic. There is diffuse interstitial opacity that was also seen previously. The hila are congested. No pleural effusion noted. No pneumothorax. Bilateral porta catheter with tips at the upper cavoatrial junction. IMPRESSION: Cardiomegaly and pulmonary opacity that is stable from 11/19/2017. The opacity could be from scarring or recurrent  pulmonary edema. Electronically Signed   By: Marnee Spring M.D.   On: 09/08/2018 04:46   Vas [place] Lower Extremity  Venous (dvt) (only [facility])  Result Date: 09/08/2018  Lower Venous Study Indications: Edema.  Performing Technologist: Blanch Media RVS  Examination Guidelines: A complete evaluation includes B-mode imaging, spectral Doppler, color Doppler, and power Doppler as needed of all accessible portions of each vessel. Bilateral testing is considered an integral part of a complete examination. Limited examinations for reoccurring indications may be performed as noted.  +---------+---------------+---------+-----------+----------+--------------+  RIGHT     Compressibility Phasicity Spontaneity Properties Summary         +---------+---------------+---------+-----------+----------+--------------+  CFV       Full            Yes       Yes                                    +---------+---------------+---------+-----------+----------+--------------+  SFJ       Full                                                             +---------+---------------+---------+-----------+----------+--------------+  FV Prox   Full                                                             +---------+---------------+---------+-----------+----------+--------------+  FV Mid    Full                                                             +---------+---------------+---------+-----------+----------+--------------+  FV Distal Full                                                             +---------+---------------+---------+-----------+----------+--------------+  PFV       Full                                                             +---------+---------------+---------+-----------+----------+--------------+  POP       Full            Yes       Yes                                    +---------+---------------+---------+-----------+----------+--------------+  PTV       Full                                                              +---------+---------------+---------+-----------+----------+--------------+  PERO                                                       Not visualized  +---------+---------------+---------+-----------+----------+--------------+   +----+---------------+---------+-----------+----------+-------+  LEFT Compressibility Phasicity Spontaneity Properties Summary  +----+---------------+---------+-----------+----------+-------+  CFV  Full            Yes       Yes                             +----+---------------+---------+-----------+----------+-------+     Summary: Right: There is no evidence of deep vein thrombosis in the lower extremity. No cystic structure found in the popliteal fossa. Left: No evidence of common femoral vein obstruction.  *See table(s) above for measurements and observations. Electronically signed by Coral Else MD on 09/08/2018 at 1:09:19 PM.    Final     Procedures Procedures (including critical care time)  Medications Ordered in ED Medications  0.45 % sodium chloride infusion ( Intravenous New Bag/Given 09/08/18 1817)  HYDROmorphone (DILAUDID) injection 2 mg (2 mg Intravenous Given 09/08/18 1816)  ketorolac (TORADOL) 15 MG/ML injection 15 mg (15 mg Intravenous Given 09/08/18 1813)  diphenhydrAMINE (BENADRYL) injection 25 mg (25 mg Intravenous Given 09/08/18 1815)  HYDROmorphone (DILAUDID) injection 2 mg (2 mg Intravenous Given 09/08/18 1906)  HYDROmorphone (DILAUDID) injection 2 mg (2 mg Intravenous Given 09/08/18 1955)  promethazine (PHENERGAN) injection 12.5 mg (12.5 mg Intravenous Given 09/08/18 1951)  iohexol (OMNIPAQUE) 350 MG/ML injection 100 mL (52 mLs Intravenous Contrast Given 09/08/18 2013)     Initial Impression / Assessment and Plan / ED Course  I have reviewed the triage vital signs and the nursing notes.  Pertinent labs & imaging results that were available during my care of the patient were reviewed by me and considered in my medical decision making (see chart for  details).       Cheyene Hamric is a 38 y.o. female who presents to ED for back and lower extremity pain c/w her typical pain crisis. Seen in ED early this am for same. Chart reviewed from this encounter. Did have DVT study to RLE given mild swelling which was normal. On exam today, patient with O2 sats of 86-88% on RA. She is not complaining of any shortness of breath or chest pain. She is afebrile. Lungs are clear. Placed on 2L [place].  CXR done this am showing cardiomegaly and stable opacity.   7:41 PM - Patient re-evaluated. Pain improved, but still present. Still not complaining of chest pain or shortness of breath. Did trial weaning oxygen with RN present. When O2 was turned off, oxygen dipped back to 87% within a few minutes. Back on 2L  and oxygenating well. Plan for CT angio to further investigate hypoxia. Anticipate admission.   Labs with worsening leukocytosis of 23.5.  CT angio with no PE or signs of PNA.  Cardiomegaly with signs suggestive of right heart failure noted.   Medicine consulted who will admit.  Final Clinical Impressions(s) / ED Diagnoses   Final diagnoses:  Sickle cell pain crisis [facility])  Hypoxia    ED Discharge Orders    None       Scotlyn Mccranie, Chase Picket, PA-C 09/08/18 2100

## 2018-09-08 NOTE — ED Notes (Signed)
Patient transported to CT 

## 2018-09-08 NOTE — ED Triage Notes (Signed)
Pt here with c.o sickle cell pain crisis, having pain in bilateral legs, was seen here last night for the same but states the pain is back. Pt a.o, nad noted.

## 2018-09-08 NOTE — ED Notes (Signed)
Pt given discharge instructions and follow up information. Pt given the opportunity to ask questions. PT verbalized understanding. PT discharged from the ED without incident.

## 2018-09-08 NOTE — ED Triage Notes (Signed)
Pt in with sickle cell crisis, c/o pain to bilateral legs and low back. States pain began yesterday, denies any sob.

## 2018-09-08 NOTE — ED Notes (Signed)
Pt 87% on room air, 2L [place] applied and saturations increased to 95%

## 2018-09-08 NOTE — ED Provider Notes (Signed)
Patient seen/examined in the Emergency Department in conjunction with Advanced Practice Provider Ge[place] Patient reports leg pain and back pain She also reports increased edema to right LE with h/o VTE Exam : awake/alert, right LE edema/tenderness noted Plan: treat pain & will also obtain DVT study    Zadie Rhine, MD 09/08/18 [phone]

## 2018-09-08 NOTE — ED Provider Notes (Addendum)
[facility] Provider Note   CSN: [id number] Arrival date & time: 09/08/18  0325    History   Chief Complaint Chief Complaint  Patient presents with   Sickle Cell Pain Crisis    HPI Joanna [name] is a 38 y.o. female.     Patient with history of sickle cell anemia, baseline hemoglobin in the eights, pain control with methadone and oxycodone 10 mg chronically, history of PE, history of narcotic abuse --presents to the emergency department today with acute onset of bilateral lower extremity pain consistent with sickle cell pain crisis.  [facility], however she states that she is currently living with her cousin who lives nearby.  Symptoms began yesterday.  No associated fevers, chest pain, or shortness of breath.  Patient has a port.  No nausea, vomiting, or diarrhea.  No swelling in the legs or color changes of the skin.  Course is constant.     Past Medical History:  Diagnosis Date   Asthma    Sickle cell anemia (HCC)     Patient Active Problem List   Diagnosis Date Noted   Community acquired pneumonia    Sickle cell crisis (HCC) 09/28/2017   Sickle cell anemia with pain (HCC) 09/28/2017   Sickle cell pain crisis (HCC) 09/21/2017   Abdominal pain 09/21/2017   Tobacco abuse 09/21/2017    History reviewed. No pertinent surgical history.   OB History    Gravida  1   Para      Term      Preterm      AB      Living        SAB      TAB      Ectopic      Multiple      Live Births               Home Medications    Prior to Admission medications   Medication Sig Start Date End Date Taking? Authorizing Provider  albuterol (PROVENTIL HFA) 108 (90 Base) MCG/ACT inhaler Inhale 2 puffs into the lungs every 6 (six) hours as needed for wheezing.  12/20/13   [provider]  DULoxetine (CYMBALTA) 30 MG capsule Take 30-60 mg by mouth See admin instructions. Take 2 capsules (60mg ) in the  morning and 1 capsule (30mg ) in the evening. 09/04/17   [provider]  fluticasone (FLONASE) 50 MCG/ACT nasal spray Place 1 spray into the nose daily as needed for allergies.     [provider]  folic acid (FOLVITE) 1 MG tablet Take 1 mg by mouth daily.    [provider]  gabapentin (NEURONTIN) 100 MG capsule Take 100 mg by mouth 3 (three) times daily. 08/17/17   [provider]  hydroxyurea (HYDREA) 100 mg/mL SUSP Take 1,000 mg by mouth daily.     [provider]  methadone (DOLOPHINE) 5 MG tablet Take 5 mg by mouth every 12 (twelve) hours.    [provider]  naloxone [place](NARCAN) nasal spray 4 mg/0.1 mL Place 1 spray into the nose once as needed (opiod overdose).  11/21/14   [provider]  Oxycodone HCl 10 MG TABS Take 10 mg by mouth every 4 (four) hours as needed (pain).     [provider]  promethazine (PHENERGAN) 25 MG tablet Take 1 tablet (25 mg total) by mouth every 6 (six) hours as needed for nausea or vomiting. 11/26/17   Muthersbaugh, Dahlia ClientHannah,  PA-C  risperiDONE (RISPERDAL) 2 MG tablet Take 2 mg by mouth 2 (two) times daily. 10/03/17   [provider]    Family History Family History  Problem Relation Age of Onset   Diabetes Father     Social History Social History   Tobacco Use   Smoking status: Current Every Day Smoker    Packs/day: 1.00   Smokeless tobacco: Current User  Substance Use Topics   Alcohol use: Not Currently    Comment: "every blue moon"   Drug use: Not Currently     Allergies   Ondansetron and Metoclopramide   Review of Systems Review of Systems  Constitutional: Negative for fever.  HENT: Negative for rhinorrhea and sore throat.   Eyes: Negative for redness.  Respiratory: Negative for cough.   Cardiovascular: Negative for chest pain.  Gastrointestinal: Negative for abdominal pain, diarrhea, nausea and vomiting.  Genitourinary: Negative for dysuria.  Musculoskeletal:  Positive for arthralgias and myalgias. Negative for joint swelling.  Skin: Negative for rash.  Neurological: Negative for headaches.     Physical Exam Updated Vital Signs BP 108/77 (BP Location: Right Arm)    Pulse 78    Temp 98.3 F (36.8 C) (Oral)    Resp 20    Wt 72.3 kg    LMP 11/15/2017 (Exact Date)    SpO2 92%    BMI 32.19 kg/m   Physical Exam Vitals signs and nursing note reviewed.  Constitutional:      Appearance: She is well-developed.  HENT:     Head: Normocephalic and atraumatic.  Eyes:     General:        Right eye: No discharge.        Left eye: No discharge.     Conjunctiva/sclera: Conjunctivae normal.  Neck:     Musculoskeletal: Normal range of motion and neck supple.  Cardiovascular:     Rate and Rhythm: Normal rate and regular rhythm.     Heart sounds: Normal heart sounds.     Comments: 2 incision scars noted in bilateral upper chest from previous port. Pulmonary:     Effort: Pulmonary effort is normal.     Breath sounds: Normal breath sounds.  Abdominal:     Palpations: Abdomen is soft.     Tenderness: There is no abdominal tenderness.  Musculoskeletal:        General: Tenderness present. No swelling.     Right lower leg: Edema (mild) present.     Left lower leg: No edema.     Comments: Generalized bilateral lower extremity tenderness.  No focal areas of swelling erythema.  No joint swelling or effusions noted.  Skin:    General: Skin is warm and dry.  Neurological:     Mental Status: She is alert.      ED Treatments / Results  Labs (all labs ordered are listed, but only abnormal results are displayed) Labs Reviewed  COMPREHENSIVE METABOLIC PANEL - Abnormal; Notable for the following components:      Result Value   Calcium 8.5 (*)    Total Protein 6.2 (*)    Alkaline Phosphatase 133 (*)    Total Bilirubin 2.5 (*)    All other components within normal limits  CBC WITH DIFFERENTIAL/PLATELET - Abnormal; Notable for the following components:    WBC 19.6 (*)    RBC 2.38 (*)    Hemoglobin 7.8 (*)    HCT 21.8 (*)    RDW 25.4 (*)    Platelets 428 (*)  nRBC 8.6 (*)    Neutro Abs 13.4 (*)    Lymphs Abs 4.2 (*)    Monocytes Absolute 1.6 (*)    Abs Immature Granulocytes 0.20 (*)    All other components within normal limits  RETICULOCYTES - Abnormal; Notable for the following components:   Retic Ct Pct 34.7 (*)    RBC. 2.38 (*)    Retic Count, Absolute 825.6 (*)    Immature Retic Fract 33.4 (*)    All other components within normal limits  I-STAT BETA HCG BLOOD, ED ([facility], [facility], [facility] ONLY)    Radiology Dg Chest Port 1 View  Result Date: 09/08/2018 CLINICAL DATA:  Sickle cell crisis with back pain. EXAM: PORTABLE CHEST 1 VIEW COMPARISON:  11/19/2017 FINDINGS: Cardiomegaly that is chronic. There is diffuse interstitial opacity that was also seen previously. The hila are congested. No pleural effusion noted. No pneumothorax. Bilateral porta catheter with tips at the upper cavoatrial junction. IMPRESSION: Cardiomegaly and pulmonary opacity that is stable from 11/19/2017. The opacity could be from scarring or recurrent pulmonary edema. Electronically Signed   By: Marnee Spring M.D.   On: 09/08/2018 04:46    Procedures Procedures (including critical care time)  Medications Ordered in ED Medications  0.45 % sodium chloride infusion (has no administration in time range)  ketorolac (TORADOL) 30 MG/ML injection 30 mg (has no administration in time range)  HYDROmorphone (DILAUDID) injection 2 mg (has no administration in time range)    Or  HYDROmorphone (DILAUDID) injection 2 mg (has no administration in time range)  HYDROmorphone (DILAUDID) injection 2 mg (has no administration in time range)    Or  HYDROmorphone (DILAUDID) injection 2 mg (has no administration in time range)  HYDROmorphone (DILAUDID) injection 2 mg (has no administration in time range)    Or  HYDROmorphone (DILAUDID) injection 2 mg (has no administration in time range)   diphenhydrAMINE (BENADRYL) capsule 25-50 mg (has no administration in time range)     Initial Impression / Assessment and Plan / ED Course  I have reviewed the triage vital signs and the nursing notes.  Pertinent labs & imaging results that were available during my care of the patient were reviewed by me and considered in my medical decision making (see chart for details).  Clinical Course as of Sep 09 215  Wed Sep 08, 2018  1914 Assessed pt. Pt is sleeping. DVT study is pending.    [AM]  0841 Pt has history of leukocytosis with sickle cell exacerbations.   WBC(!): 19.6 [AM]  0842 At baseline.   Hemoglobin(!): 7.8 [AM]  0843 Patient has chronic thrombocytosis.   Platelets(!): 428 [AM]  0843 Consistent with prior SS crises.   Retic Ct Pct(!): 34.7 [AM]    Clinical Course User Index [AM] Elisha Ponder, PA-C       Patient seen and examined. Work-up initiated. Medications ordered.  Will treat patient for sickle cell pain crisis.  Reviewed reviewed records from both the current system and outside systems.  [place] substance reporting database reviewed.  Vital signs reviewed and are as follows: BP 108/77 (BP Location: Right Arm)    Pulse 78    Temp 98.3 F (36.8 C) (Oral)    Resp 20    Wt 72.3 kg    LMP 11/15/2017 (Exact Date)    SpO2 92%    BMI 32.19 kg/m   4:54 AM X-ray images personally reviewed and interpreted.    7:20 AM Awaiting DVT study, reeval for pain  control.   Handoff to [facility] at shift change.    Final Clinical Impressions(s) / ED Diagnoses   Final diagnoses:  Sickle cell crisis (HCC)   Pt here with sickle cell anemia, no sign of acute chest. No fever. She looks well.    ED Discharge Orders    None       Desmond Dike 09/08/18 2130    Zadie Rhine, MD 09/08/18 8657    Renne Crigler, PA-C 09/09/18 Harvel Ricks    Zadie Rhine, MD 09/09/18 Earle Gell

## 2018-09-09 ENCOUNTER — Inpatient Hospital Stay (HOSPITAL_COMMUNITY): Payer: Medicaid Other

## 2018-09-09 ENCOUNTER — Encounter (HOSPITAL_COMMUNITY): Payer: Medicaid Other

## 2018-09-09 DIAGNOSIS — D5700 Hb-SS disease with crisis, unspecified: Principal | ICD-10-CM

## 2018-09-09 DIAGNOSIS — R0902 Hypoxemia: Secondary | ICD-10-CM

## 2018-09-09 DIAGNOSIS — I361 Nonrheumatic tricuspid (valve) insufficiency: Secondary | ICD-10-CM

## 2018-09-09 DIAGNOSIS — R109 Unspecified abdominal pain: Secondary | ICD-10-CM

## 2018-09-09 DIAGNOSIS — J189 Pneumonia, unspecified organism: Secondary | ICD-10-CM

## 2018-09-09 DIAGNOSIS — I340 Nonrheumatic mitral (valve) insufficiency: Secondary | ICD-10-CM

## 2018-09-09 LAB — COMPREHENSIVE METABOLIC PANEL
ALT: 14 U/L (ref 0–44)
AST: 23 U/L (ref 15–41)
Albumin: 3.4 g/dL — ABNORMAL LOW (ref 3.5–5.0)
Alkaline Phosphatase: 119 U/L (ref 38–126)
Anion gap: 7 (ref 5–15)
BUN: 8 mg/dL (ref 6–20)
CO2: 27 mmol/L (ref 22–32)
Calcium: 8.1 mg/dL — ABNORMAL LOW (ref 8.9–10.3)
Chloride: 105 mmol/L (ref 98–111)
Creatinine, Ser: 0.57 mg/dL (ref 0.44–1.00)
GFR calc Af Amer: >60 mL/min (ref >60)
GFR calc non Af Amer: >60 mL/min (ref >60)
Glucose, Bld: 95 mg/dL (ref 70–99)
Potassium: 4 mmol/L (ref 3.5–5.1)
Sodium: 139 mmol/L (ref 135–145)
Total Bilirubin: 3.3 mg/dL — ABNORMAL HIGH (ref 0.3–1.2)
Total Protein: 6.1 g/dL — ABNORMAL LOW (ref 6.5–8.1)

## 2018-09-09 LAB — RAPID URINE DRUG SCREEN, HOSP PERFORMED
Amphetamines: NOT DETECTED
Barbiturates: NOT DETECTED
Benzodiazepines: NOT DETECTED
Cocaine: NOT DETECTED
Opiates: POSITIVE — AB
Tetrahydrocannabinol: NOT DETECTED

## 2018-09-09 LAB — ECHOCARDIOGRAM COMPLETE
Height: 59 in
Weight: 2539.7 oz

## 2018-09-09 LAB — CBC WITH DIFFERENTIAL/PLATELET
Abs Immature Granulocytes: 0.16 10*3/uL — ABNORMAL HIGH (ref 0.00–0.07)
Basophils Absolute: 0.1 10*3/uL (ref 0.0–0.1)
Basophils Relative: 0 %
Eosinophils Absolute: 0.4 10*3/uL (ref 0.0–0.5)
Eosinophils Relative: 2 %
HCT: 17.9 % — ABNORMAL LOW (ref 36.0–46.0)
Hemoglobin: 6.5 g/dL — CL (ref 12.0–15.0)
Immature Granulocytes: 1 %
Lymphocytes Relative: 28 %
Lymphs Abs: 4.9 10*3/uL — ABNORMAL HIGH (ref 0.7–4.0)
MCH: 31.6 pg (ref 26.0–34.0)
MCHC: 36.3 g/dL — ABNORMAL HIGH (ref 30.0–36.0)
MCV: 86.9 fL (ref 80.0–100.0)
Monocytes Absolute: 1.3 10*3/uL — ABNORMAL HIGH (ref 0.1–1.0)
Monocytes Relative: 7 %
Neutro Abs: 10.9 10*3/uL — ABNORMAL HIGH (ref 1.7–7.7)
Neutrophils Relative %: 62 %
Platelets: 353 10*3/uL (ref 150–400)
RBC: 2.06 MIL/uL — ABNORMAL LOW (ref 3.87–5.11)
RDW: 23.6 % — ABNORMAL HIGH (ref 11.5–15.5)
WBC: 17.7 10*3/uL — ABNORMAL HIGH (ref 4.0–10.5)
nRBC: 5.2 % — ABNORMAL HIGH (ref 0.0–0.2)

## 2018-09-09 LAB — BRAIN NATRIURETIC PEPTIDE: B Natriuretic Peptide: 75.3 pg/mL (ref 0.0–100.0)

## 2018-09-09 LAB — LACTATE DEHYDROGENASE: LDH: 251 U/L — ABNORMAL HIGH (ref 98–192)

## 2018-09-09 LAB — TROPONIN I: Troponin I: <0.03 ng/mL (ref <0.03)

## 2018-09-09 MED ORDER — HEPARIN SOD (PORK) LOCK FLUSH 100 UNIT/ML IV SOLN
500.0000 [IU] | INTRAVENOUS | Status: AC | PRN
  Administered 2018-09-09: 500 [IU]
  Filled 2018-09-09: qty 5

## 2018-09-09 NOTE — Plan of Care (Signed)
  Problem: Education: Goal: Knowledge of General Education information will improve Description Including pain rating scale, medication(s)/side effects and non-pharmacologic comfort measures 09/09/2018 1538 by Coy Saunas, RN Outcome: Adequate for Discharge 09/09/2018 1537 by Coy Saunas, RN Outcome: Adequate for Discharge 09/09/2018 0959 by Coy Saunas, RN Outcome: Progressing   Problem: Clinical Measurements: Goal: Will remain free from infection 09/09/2018 1538 by Coy Saunas, RN Outcome: Adequate for Discharge 09/09/2018 1537 by Coy Saunas, RN Outcome: Adequate for Discharge 09/09/2018 0959 by Coy Saunas, RN Outcome: Progressing Goal: Diagnostic test results will improve 09/09/2018 1538 by Coy Saunas, RN Outcome: Adequate for Discharge 09/09/2018 1537 by Coy Saunas, RN Outcome: Adequate for Discharge 09/09/2018 0959 by Coy Saunas, RN Outcome: Progressing Goal: Respiratory complications will improve 09/09/2018 1538 by Coy Saunas, RN Outcome: Adequate for Discharge 09/09/2018 1537 by Coy Saunas, RN Outcome: Adequate for Discharge 09/09/2018 0959 by Coy Saunas, RN Outcome: Progressing   Problem: Activity: Goal: Risk for activity intolerance will decrease 09/09/2018 1538 by Coy Saunas, RN Outcome: Adequate for Discharge 09/09/2018 1537 by Coy Saunas, RN Outcome: Adequate for Discharge 09/09/2018 0959 by Coy Saunas, RN Outcome: Progressing   Problem: Pain Managment: Goal: General experience of comfort will improve 09/09/2018 1538 by Coy Saunas, RN Outcome: Adequate for Discharge 09/09/2018 1537 by Coy Saunas, RN Outcome: Adequate for Discharge 09/09/2018 0959 by Coy Saunas, RN Outcome: Progressing

## 2018-09-09 NOTE — Plan of Care (Signed)
  Problem: Education: Goal: Knowledge of General Education information will improve Description Including pain rating scale, medication(s)/side effects and non-pharmacologic comfort measures Outcome: Progressing   

## 2018-09-09 NOTE — Discharge Instructions (Signed)
°Resume all home medications.  °Follow up with PCP as previously  scheduled.  ° °Discussed the importance of drinking 64 ounces of water daily to  help prevent pain crises, it is important to drink plenty of water throughout the day. This is because dehydration of red blood cells may lead further sickling.  ° °Avoid all stressors that precipitate sickle cell pain crisis.   ° ° °The patient was given clear instructions to go to ER or return to medical center if symptoms do not improve, worsen or new problems develop.  ° ° ° °Sickle Cell Anemia, Adult °Sickle cell anemia is a condition where your red blood cells are shaped like sickles. Red blood cells carry oxygen through the body. Sickle-shaped cells do not live as long as normal red blood cells. They also clump together and block blood from flowing through the blood vessels. This prevents the body from getting enough oxygen. Sickle cell anemia causes organ damage and pain. It also increases the risk of infection. °Follow these instructions at home: °Medicines °· Take over-the-counter and prescription medicines only as told by your doctor. °· If you were prescribed an antibiotic medicine, take it as told by your doctor. Do not stop taking the antibiotic even if you start to feel better. °· If you develop a fever, do not take medicines to lower the fever right away. Tell your doctor about the fever. °Managing pain, stiffness, and swelling °· Try these methods to help with pain: °? Use a heating pad. °? Take a warm bath. °? Distract yourself, such as by watching TV. °Eating and drinking °· Drink enough fluid to keep your pee (urine) clear or pale yellow. Drink more in hot weather and during exercise. °· Limit or avoid alcohol. °· Eat a healthy diet. Eat plenty of fruits, vegetables, whole grains, and lean protein. °· Take vitamins and supplements as told by your doctor. °Traveling °· When traveling, keep these with you: °? Your medical information. °? The names of  your doctors. °? Your medicines. °· If you need to take an airplane, talk to your doctor first. °Activity °· Rest often. °· Avoid exercises that make your heart beat much faster, such as jogging. °General instructions °· Do not use products that have nicotine or tobacco, such as cigarettes and e-cigarettes. If you need help quitting, ask your doctor. °· Consider wearing a medical alert bracelet. °· Avoid being in high places (high altitudes), such as mountains. °· Avoid very hot or cold temperatures. °· Avoid places where the temperature changes a lot. °· Keep all follow-up visits as told by your doctor. This is important. °Contact a doctor if: °· A joint hurts. °· Your feet or hands hurt or swell. °· You feel tired (fatigued). °Get help right away if: °· You have symptoms of infection. These include: °? Fever. °? Chills. °? Being very tired. °? Irritability. °? Poor eating. °? Throwing up (vomiting). °· You feel dizzy or faint. °· You have new stomach pain, especially on the left side. °· You have a an erection (priapism) that lasts more than 4 hours. °· You have numbness in your arms or legs. °· You have a hard time moving your arms or legs. °· You have trouble talking. °· You have pain that does not go away when you take medicine. °· You are short of breath. °· You are breathing fast. °· You have a long-term cough. °· You have pain in your chest. °· You have a bad headache. °·   You have a stiff neck. °· Your stomach looks bloated even though you did not eat much. °· Your skin is pale. °· You suddenly cannot see well. °Summary °· Sickle cell anemia is a condition where your red blood cells are shaped like sickles. °· Follow your doctor's advice on ways to manage pain, food to eat, activities to do, and steps to take for safe travel. °· Get medical help right away if you have any signs of infection, such as a fever. °This information is not intended to replace advice given to you by your health care provider. Make  sure you discuss any questions you have with your health care provider. °Document Released: 01/19/2013 Document Revised: 05/06/2016 Document Reviewed: 05/06/2016 °Elsevier Interactive Patient Education © 2019 [employer] ° °

## 2018-09-09 NOTE — Progress Notes (Signed)
  Echocardiogram 2D Echocardiogram has been performed.  Taiyana Kissler L Androw 09/09/2018, 10:17 AM

## 2018-09-09 NOTE — Progress Notes (Signed)
Wasted 20 ml of dilaudid from PCA pump with Gerlene Fee, Press photographer.

## 2018-09-09 NOTE — Plan of Care (Signed)
Atient requested d/c  will follow up outpatient if needed.

## 2018-09-09 NOTE — Discharge Summary (Signed)
Physician Discharge Summary  Joanna [name] TLX:[id number] DOB: 08-05-1980 DOA: 09/08/2018  PCP: Patient, No Pcp Per  Admit date: 09/08/2018  Discharge date: 09/09/2018  Discharge Diagnoses:  Principal Problem:   Sickle cell pain crisis [facility])   Discharge Condition: Stable  Disposition:  Follow-up Information    Tonny Branch., MD Follow up in 1 week(s).   Specialty:  Adolescent Medicine Contact information: [address] [address] [phone]          Pt is discharged home in good condition and is to follow up with Dr. Janeth Rase this week to have labs evaluated. Joanna [name] is instructed to increase activity slowly and balance with rest for the next few days, and use prescribed medication to complete treatment of pain  Diet: Regular Wt Readings from Last 3 Encounters:  09/08/18 72 kg  09/08/18 72.3 kg  11/26/17 72.3 kg    History of present illness:  Joanna [name], a 38 year old female with a medical history significant for sickle cell disease, chronic pain syndrome, opiate dependence, mild intermittent asthma, and schizophrenia presented to ER with complaints of increasing pain in both lower extremities over the past 3 days.  Patient also endorsed some productive cough for greater than 1 week.  She denies shortness of breath, fever, chills, chest pain, headache, or any visual symptoms.  Patient has been taking methadone and oxycodone for chronic pain.  She states that she stopped taking hydroxyurea months ago.  Patient has been taking home medications without relief.  Patient is followed by [facility] hematology and receives monthly exchange transfusions.  [facility] hematology also prescribes patient's pain medications.  ER course: In ER patient was found to be hypoxic, she required 2 L of oxygen to maintain oxygen saturation. CT angiogram of chest was completed, which was negative for PE.  CT was concerning for right heart failure.  On exam, mild edema of both  lower extremities patient admitted for sickle cell pain crisis and further work-up of hypoxia.  Patient's labs show hemoglobin to be at baseline patient's WBC count is 23.5 increased from 19 a few hours ago.  Patient afebrile.  COVID-19 test negative  Hospital Course:  Sickle cell anemia with pain crisis: Patient was admitted for sickle cell pain crisis and managed appropriately with IVF, IV Dilaudid via PCA and IV Toradol, as well as other adjunct therapies per sickle cell pain management protocols.  Patient's methadone was continued without interruption.  Pain intensity decreased appropriately and patient was requesting discharge home. On admission, hypoxia noted and patient was requiring 2 L of oxygen to maintain saturation above 90%.  Oxygen saturation improved, patient currently saturating 100% on RA.  BNP negative.  Echocardiogram reviewed by me, EF 60-65%, unremarkable.  Patient advised to follow-up with PCP in 2 weeks.  Patient has a history of tobacco abuse and is a chronic every day smoker.  Patient advised to quit, she states that she is not ready at this time.  She also says that she has had a smoker's cough over the past several years and has been advised at length to quit smoking.  Patient has a history of anemia of chronic disease.  She is followed by hematology at [facility] and has a follow-up appointment scheduled for Monday September 14, 2018.  Hemoglobin prior to discharge 6.5.  Patient states that she undergoes monthly exchange transfusions.  Leukocytosis present.  Patient remained afebrile.  No signs of infection or inflammation.  Considered to be reactive.  Patient advised  to follow-up in primary care to repeat CBC in 1 week.  Patient also has a history of schizophrenia.  She states that she is followed by psychiatry outpatient.  Continue Risperdal and duloxetine as prescribed.  Patient alert, oriented, and ambulating without assistance.  Patient's pain intensity has decreased to 3/10, which  is consistent with her goal.  Patient is afebrile and maintaining oxygen saturation at 100% on RA.  Patient was discharged home today in a hemodynamically stable condition.   Discharge Exam: Vitals:   09/09/18 1300 09/09/18 1500  BP:    Pulse:    Resp: 14 12  Temp:    SpO2: 99% 100%   Vitals:   09/09/18 0837 09/09/18 0900 09/09/18 1300 09/09/18 1500  BP: 90/61     Pulse: 84     Resp:  '15 14 12  ' Temp: 98.1 F (36.7 C)     TempSrc:      SpO2: 99%  99% 100%  Weight:      Height:        General appearance : Awake, alert, not in any distress. Speech Clear. Not toxic looking HEENT: Atraumatic and Normocephalic, pupils equally reactive to light and accomodation Neck: Supple, no JVD. No cervical lymphadenopathy.  Chest: Good air entry bilaterally, no added sounds  CVS: S1 S2 regular, no murmurs.  Abdomen: Bowel sounds present, Non tender and not distended with no gaurding, rigidity or rebound. Extremities: B/L Lower Ext shows no edema, both legs are warm to touch Neurology: Awake alert, and oriented X 3, CN II-XII intact, Non focal Skin: No Rash  Discharge Instructions  Discharge Instructions    Discharge patient   Complete by:  As directed    Discharge disposition:  01-Home or Self Care   Discharge patient date:  09/09/2018     Allergies as of 09/09/2018      Reactions   Ondansetron Hives, Itching, Rash      Metoclopramide Hives, Itching, Rash      Medication List    TAKE these medications   DULoxetine 30 MG capsule Commonly known as:  CYMBALTA Take 30-60 mg by mouth See admin instructions. Take 2 capsules (28m) in the morning and 1 capsule (39m in the evening.   fluticasone 50 MCG/ACT nasal spray Commonly known as:  FLONASE Place 1 spray into the nose daily as needed for allergies.   folic acid 1 MG tablet Commonly known as:  FOLVITE Take 1 mg by mouth daily.   gabapentin 100 MG capsule Commonly known as:  NEURONTIN Take 100 mg by mouth 3 (three) times  daily.   hydroxyurea 100 mg/mL Susp Commonly known as:  HYDREA Take 1,000 mg by mouth daily.   methadone 5 MG tablet Commonly known as:  DOLOPHINE Take 5 mg by mouth every 12 (twelve) hours.   Narcan 4 MG/0.1ML Liqd nasal spray kit Generic drug:  naloxone Place 1 spray into the nose once as needed (opiod overdose).   Oxycodone HCl 10 MG Tabs Take 10 mg by mouth every 4 (four) hours as needed (pain).   promethazine 25 MG tablet Commonly known as:  PHENERGAN Take 1 tablet (25 mg total) by mouth every 6 (six) hours as needed for nausea or vomiting.   Proventil HFA 108 (90 Base) MCG/ACT inhaler Generic drug:  albuterol Inhale 2 puffs into the lungs every 6 (six) hours as needed for wheezing.   risperiDONE 2 MG tablet Commonly known as:  RISPERDAL Take 2 mg by mouth 2 (two) times daily.  The results of significant diagnostics from this hospitalization (including imaging, microbiology, ancillary and laboratory) are listed below for reference.    Significant Diagnostic Studies: Ct Angio Chest Pe W And/or Wo Contrast  Result Date: 09/08/2018 CLINICAL DATA:  Sickle cell anemia. Hypoxia. EXAM: CT ANGIOGRAPHY CHEST WITH CONTRAST TECHNIQUE: Multidetector CT imaging of the chest was performed using the standard protocol during bolus administration of intravenous contrast. Multiplanar CT image reconstructions and MIPs were obtained to evaluate the vascular anatomy. CONTRAST:  56m OMNIPAQUE IOHEXOL 350 MG/ML SOLN COMPARISON:  Chest radiograph from earlier today. FINDINGS: Cardiovascular: The study is moderate to high quality for the evaluation of pulmonary embolism, with slightly suboptimal bolus opacification. There are no filling defects in the central, lobar, segmental or subsegmental pulmonary artery branches to suggest acute pulmonary embolism. Normal course and caliber of the thoracic aorta. Dilated main pulmonary artery (4.1 cm diameter). Mild cardiomegaly. No significant  pericardial fluid/thickening. Left internal jugular Port-A-Cath terminates at the cavoatrial junction. Right internal jugular Port-A-Cath terminates at the cavoatrial junction. Mediastinum/Nodes: No discrete thyroid nodules. Unremarkable esophagus. No axillary adenopathy. Enlarged 1.7 cm right paratracheal node (series 6/image 35). Mild bilateral hilar adenopathy, largest 1.0 cm on the right (series 6/image 41) and 1.0 cm on the left (series 6/image 49). Lungs/Pleura: No pneumothorax. No pleural effusion. No acute consolidative airspace disease, lung masses or significant pulmonary nodules. Mosaic attenuation throughout both lungs. Scattered parenchymal bands at both lung bases compatible with mild scarring or atelectasis. Upper abdomen: Contrast reflux into IVC and hepatic veins. Autosplenectomy. Musculoskeletal: No aggressive appearing focal osseous lesions. Minimal endplate concavity in the midthoracic vertebral bodies. Review of the MIP images confirms the above findings. IMPRESSION: 1. No evidence of pulmonary embolism. 2. Prominently dilated main pulmonary artery, compatible with pulmonary arterial hypertension. 3. Cardiomegaly. Contrast reflux into the IVC and hepatic veins, suggesting right heart failure. 4. Mosaic attenuation throughout both lungs, nonspecific, probably due to mosaic perfusion from pulmonary vascular disease. 5. Mild mediastinal and bilateral hilar lymphadenopathy, nonspecific, most likely reactive. Electronically Signed   By: JIlona SorrelM.D.   On: 09/08/2018 20:37   Dg Chest Port 1 View  Result Date: 09/08/2018 CLINICAL DATA:  Sickle cell crisis with back pain. EXAM: PORTABLE CHEST 1 VIEW COMPARISON:  11/19/2017 FINDINGS: Cardiomegaly that is chronic. There is diffuse interstitial opacity that was also seen previously. The hila are congested. No pleural effusion noted. No pneumothorax. Bilateral porta catheter with tips at the upper cavoatrial junction. IMPRESSION: Cardiomegaly and  pulmonary opacity that is stable from 11/19/2017. The opacity could be from scarring or recurrent pulmonary edema. Electronically Signed   By: JMonte FantasiaM.D.   On: 09/08/2018 04:46   Vas UKoreaLower Extremity Venous (dvt) (only [facility])  Result Date: 09/08/2018  Lower Venous Study Indications: Edema.  Performing Technologist: MAbram SanderRVS  Examination Guidelines: A complete evaluation includes B-mode imaging, spectral Doppler, color Doppler, and power Doppler as needed of all accessible portions of each vessel. Bilateral testing is considered an integral part of a complete examination. Limited examinations for reoccurring indications may be performed as noted.  +---------+---------------+---------+-----------+----------+--------------+ RIGHT    CompressibilityPhasicitySpontaneityPropertiesSummary        +---------+---------------+---------+-----------+----------+--------------+ CFV      Full           Yes      Yes                                 +---------+---------------+---------+-----------+----------+--------------+  SFJ      Full                                                        +---------+---------------+---------+-----------+----------+--------------+ FV Prox  Full                                                        +---------+---------------+---------+-----------+----------+--------------+ FV Mid   Full                                                        +---------+---------------+---------+-----------+----------+--------------+ FV DistalFull                                                        +---------+---------------+---------+-----------+----------+--------------+ PFV      Full                                                        +---------+---------------+---------+-----------+----------+--------------+ POP      Full           Yes      Yes                                  +---------+---------------+---------+-----------+----------+--------------+ PTV      Full                                                        +---------+---------------+---------+-----------+----------+--------------+ PERO                                                  Not visualized +---------+---------------+---------+-----------+----------+--------------+   +----+---------------+---------+-----------+----------+-------+ LEFTCompressibilityPhasicitySpontaneityPropertiesSummary +----+---------------+---------+-----------+----------+-------+ CFV Full           Yes      Yes                          +----+---------------+---------+-----------+----------+-------+     Summary: Right: There is no evidence of deep vein thrombosis in the lower extremity. No cystic structure found in the popliteal fossa. Left: No evidence of common femoral vein obstruction.  *See table(s) above for measurements and observations. Electronically signed by Harold Barban MD on 09/08/2018 at 1:09:19 PM.    Final     Microbiology: Recent Results (from the past 240 hour(s))  SARS  Coronavirus 2 (CEPHEID - Performed in [facility] hospital lab), Hosp Order     Status: None   Collection Time: 09/08/18  7:50 PM  Result Value Ref Range Status   SARS Coronavirus 2 NEGATIVE NEGATIVE Final    Comment: (NOTE) If result is NEGATIVE SARS-CoV-2 target nucleic acids are NOT DETECTED. The SARS-CoV-2 RNA is generally detectable in upper and lower  respiratory specimens during the acute phase of infection. The lowest  concentration of SARS-CoV-2 viral copies this assay can detect is 250  copies / mL. A negative result does not preclude SARS-CoV-2 infection  and should not be used as the sole basis for treatment or other  patient management decisions.  A negative result may occur with  improper specimen collection / handling, submission of specimen other  than nasopharyngeal swab, presence of viral mutation(s)  within the  areas targeted by this assay, and inadequate number of viral copies  (<250 copies / mL). A negative result must be combined with clinical  observations, patient history, and epidemiological information. If result is POSITIVE SARS-CoV-2 target nucleic acids are DETECTED. The SARS-CoV-2 RNA is generally detectable in upper and lower  respiratory specimens dur ing the acute phase of infection.  Positive  results are indicative of active infection with SARS-CoV-2.  Clinical  correlation with patient history and other diagnostic information is  necessary to determine patient infection status.  Positive results do  not rule out bacterial infection or co-infection with other viruses. If result is PRESUMPTIVE POSTIVE SARS-CoV-2 nucleic acids MAY BE PRESENT.   A presumptive positive result was obtained on the submitted specimen  and confirmed on repeat testing.  While 2019 novel coronavirus  (SARS-CoV-2) nucleic acids may be present in the submitted sample  additional confirmatory testing may be necessary for epidemiological  and / or clinical management purposes  to differentiate between  SARS-CoV-2 and other Sarbecovirus currently known to infect humans.  If clinically indicated additional testing with an alternate test  methodology [phone]) is advised. The SARS-CoV-2 RNA is generally  detectable in upper and lower respiratory sp ecimens during the acute  phase of infection. The expected result is Negative. Fact Sheet for Patients:  StrictlyIdeas.no Fact Sheet for Healthcare Providers: BankingDealers.co.za This test is not yet approved or cleared by the [place] FDA and has been authorized for detection and/or diagnosis of SARS-CoV-2 by FDA under an Emergency Use Authorization (EUA).  This EUA will remain in effect (meaning this test can be used) for the duration of the COVID-19 declaration under Section 564(b)(1) of the Act,  21 U.S.C. section 360bbb-3(b)(1), unless the authorization is terminated or revoked sooner. Performed at [facility], [place] [address] [place], [place] [address]      Labs: Basic Metabolic Panel: Recent Labs  Lab 09/08/18 0519 09/08/18 1812 09/09/18 0500  NA 137 137 139  K 4.1 3.9 4.0  CL 106 107 105  CO2 22 21* 27  GLUCOSE 98 101* 95  BUN '7 8 8  ' CREATININE 0.57 0.55 0.57  CALCIUM 8.5* 8.6* 8.1*   Liver Function Tests: Recent Labs  Lab 09/08/18 0519 09/09/18 0500  AST 23 23  ALT 12 14  ALKPHOS 133* 119  BILITOT 2.5* 3.3*  PROT 6.2* 6.1*  ALBUMIN 3.6 3.4*   No results for input(s): LIPASE, AMYLASE in the last 168 hours. No results for input(s): AMMONIA in the last 168 hours. CBC: Recent Labs  Lab 09/08/18 0519 09/08/18 1812 09/09/18 0500  WBC 19.6* 23.5* 17.7*  NEUTROABS 13.4* 18.5* 10.9*  HGB 7.8* 8.0* 6.5*  HCT 21.8* 25.0* 17.9*  MCV 91.6 96.2 86.9  PLT 428* 540* 353   Cardiac Enzymes: Recent Labs  Lab 09/09/18 0500  TROPONINI <0.03   BNP: Invalid input(s): POCBNP CBG: No results for input(s): GLUCAP in the last 168 hours.  Time coordinating discharge: 50 minutes  Signed:  Donia Pounds  APRN, MSN, FNP-C [facility] [address] [place], [place] [address] [phone]  [facility] 09/09/2018, 4:37 PM

## 2018-09-09 NOTE — Progress Notes (Addendum)
Patient wants to be discharged.  I left a message for the doctor listed on her chart but he did not return a call.  Later learned that she has a sickle cell doctor.  Looked up who that is and paged her , she will be here to see the patient shortly.

## 2018-09-13 ENCOUNTER — Encounter (HOSPITAL_COMMUNITY): Payer: Self-pay | Admitting: Emergency Medicine

## 2018-09-13 ENCOUNTER — Emergency Department (HOSPITAL_COMMUNITY)
Admission: EM | Admit: 2018-09-13 | Discharge: 2018-09-13 | Disposition: A | Discharge status: Home / Self Care | Payer: Medicaid Other | Attending: Emergency Medicine | Admitting: Emergency Medicine

## 2018-09-13 ENCOUNTER — Other Ambulatory Visit: Payer: Self-pay

## 2018-09-13 DIAGNOSIS — D5700 Hb-SS disease with crisis, unspecified: Secondary | ICD-10-CM | POA: Diagnosis present

## 2018-09-13 DIAGNOSIS — Z5321 Procedure and treatment not carried out due to patient leaving prior to being seen by health care provider: Secondary | ICD-10-CM | POA: Diagnosis not present

## 2018-09-13 MED ORDER — SODIUM CHLORIDE 0.9% FLUSH: 3.0000 mL | Freq: Once | INTRAVENOUS | Status: DC

## 2018-09-13 NOTE — ED Notes (Signed)
Patient reports she has decided to leave, states she does not want to wait any longer. Patient encouraged to stay and advised to return if symptoms worsen. Pt ambulatory out of ED with her mother.

## 2018-09-13 NOTE — ED Triage Notes (Signed)
Patient c/o sickle pain to bilateral arms, legs and lower back since last week. Seen or same on 5/27. Has taken oxycodone and methadone for her pain, approx 4 hours ago. Denies fevers or chills.

## 2018-09-18 ENCOUNTER — Other Ambulatory Visit: Payer: Self-pay

## 2018-09-18 ENCOUNTER — Encounter (HOSPITAL_COMMUNITY): Payer: Self-pay | Admitting: Emergency Medicine

## 2018-09-18 ENCOUNTER — Emergency Department (HOSPITAL_COMMUNITY)
Admission: EM | Admit: 2018-09-18 | Discharge: 2018-09-18 | Disposition: A | Discharge status: Home / Self Care | Payer: Medicaid Other | Attending: Emergency Medicine | Admitting: Emergency Medicine

## 2018-09-18 DIAGNOSIS — F17290 Nicotine dependence, other tobacco product, uncomplicated: Secondary | ICD-10-CM | POA: Diagnosis not present

## 2018-09-18 DIAGNOSIS — Z79899 Other long term (current) drug therapy: Secondary | ICD-10-CM | POA: Diagnosis not present

## 2018-09-18 DIAGNOSIS — J45909 Unspecified asthma, uncomplicated: Secondary | ICD-10-CM | POA: Diagnosis not present

## 2018-09-18 DIAGNOSIS — F17200 Nicotine dependence, unspecified, uncomplicated: Secondary | ICD-10-CM | POA: Insufficient documentation

## 2018-09-18 DIAGNOSIS — D5700 Hb-SS disease with crisis, unspecified: Secondary | ICD-10-CM | POA: Diagnosis not present

## 2018-09-18 LAB — COMPREHENSIVE METABOLIC PANEL
ALT: 13 U/L (ref 0–44)
AST: 34 U/L (ref 15–41)
Albumin: 3.7 g/dL (ref 3.5–5.0)
Alkaline Phosphatase: 122 U/L (ref 38–126)
Anion gap: 9 (ref 5–15)
BUN: <5 mg/dL — ABNORMAL LOW (ref 6–20)
CO2: 22 mmol/L (ref 22–32)
Calcium: 8.5 mg/dL — ABNORMAL LOW (ref 8.9–10.3)
Chloride: 105 mmol/L (ref 98–111)
Creatinine, Ser: 0.59 mg/dL (ref 0.44–1.00)
GFR calc Af Amer: >60 mL/min (ref >60)
GFR calc non Af Amer: >60 mL/min (ref >60)
Glucose, Bld: 92 mg/dL (ref 70–99)
Potassium: 4.2 mmol/L (ref 3.5–5.1)
Sodium: 136 mmol/L (ref 135–145)
Total Bilirubin: 2.7 mg/dL — ABNORMAL HIGH (ref 0.3–1.2)
Total Protein: 6.3 g/dL — ABNORMAL LOW (ref 6.5–8.1)

## 2018-09-18 LAB — CBC WITH DIFFERENTIAL/PLATELET
Abs Immature Granulocytes: 0.3 10*3/uL — ABNORMAL HIGH (ref 0.00–0.07)
Basophils Absolute: 0 10*3/uL (ref 0.0–0.1)
Basophils Relative: 0 %
Eosinophils Absolute: 1 10*3/uL — ABNORMAL HIGH (ref 0.0–0.5)
Eosinophils Relative: 4 %
HCT: 21.5 % — ABNORMAL LOW (ref 36.0–46.0)
Hemoglobin: 7.5 g/dL — ABNORMAL LOW (ref 12.0–15.0)
Immature Granulocytes: 1 %
Lymphocytes Relative: 28 %
Lymphs Abs: 6 10*3/uL — ABNORMAL HIGH (ref 0.7–4.0)
MCH: 32.6 pg (ref 26.0–34.0)
MCHC: 34.9 g/dL (ref 30.0–36.0)
MCV: 93.5 fL (ref 80.0–100.0)
Monocytes Absolute: 2 10*3/uL — ABNORMAL HIGH (ref 0.1–1.0)
Monocytes Relative: 10 %
Neutro Abs: 12 10*3/uL — ABNORMAL HIGH (ref 1.7–7.7)
Neutrophils Relative %: 57 %
Platelets: 520 10*3/uL — ABNORMAL HIGH (ref 150–400)
RBC: 2.3 MIL/uL — ABNORMAL LOW (ref 3.87–5.11)
RDW: 29.2 % — ABNORMAL HIGH (ref 11.5–15.5)
WBC: 21 10*3/uL — ABNORMAL HIGH (ref 4.0–10.5)
nRBC: 8.1 % — ABNORMAL HIGH (ref 0.0–0.2)

## 2018-09-18 LAB — RETICULOCYTES
Immature Retic Fract: 34.6 % — ABNORMAL HIGH (ref 2.3–15.9)
RBC.: 2.3 MIL/uL — ABNORMAL LOW (ref 3.87–5.11)
Retic Count, Absolute: >720 10*3/uL — ABNORMAL HIGH (ref 19.0–186.0)
Retic Ct Pct: >30 % — ABNORMAL HIGH (ref 0.4–3.1)

## 2018-09-18 MED ORDER — HYDROMORPHONE HCL 1 MG/ML IJ SOLN
2.0000 mg | INTRAMUSCULAR | Status: AC
  Administered 2018-09-18: 2 mg via SUBCUTANEOUS
  Filled 2018-09-18: qty 2

## 2018-09-18 MED ORDER — HYDROMORPHONE HCL 1 MG/ML IJ SOLN: 2.0000 mg | INTRAMUSCULAR | Status: AC

## 2018-09-18 MED ORDER — PROMETHAZINE HCL 25 MG PO TABS: 25.0000 mg | ORAL_TABLET | ORAL | Status: DC | PRN

## 2018-09-18 MED ORDER — HYDROMORPHONE HCL 1 MG/ML IJ SOLN: 2.0000 mg | INTRAMUSCULAR | Status: DC

## 2018-09-18 MED ORDER — HYDROMORPHONE HCL 1 MG/ML IJ SOLN
2.0000 mg | INTRAMUSCULAR | Status: AC
  Administered 2018-09-18: 04:00:00 2 mg via SUBCUTANEOUS
  Filled 2018-09-18: qty 2

## 2018-09-18 MED ORDER — HYDROMORPHONE HCL 1 MG/ML IJ SOLN
2.0000 mg | INTRAMUSCULAR | Status: AC
  Filled 2018-09-18: qty 2

## 2018-09-18 MED ORDER — HYDROMORPHONE HCL 1 MG/ML IJ SOLN
2.0000 mg | INTRAMUSCULAR | Status: DC
  Administered 2018-09-18: 2 mg via SUBCUTANEOUS

## 2018-09-18 MED ORDER — DIPHENHYDRAMINE HCL 25 MG PO CAPS: 25.0000 mg | ORAL_CAPSULE | ORAL | Status: DC | PRN

## 2018-09-18 NOTE — ED Notes (Signed)
Discharge instructions discussed with pt. Pt verbalized understanding. Pt stable and ambulatory. No signature pad available. 

## 2018-09-18 NOTE — ED Triage Notes (Signed)
Pt presents to ED POV c/o sickle cell pain crisis that started yesterday. Pt says her pain is in her legs and arms. Pain is intermittent and 7/10. Has been using heat pads for the pain. Pt took methadone and oxycodone 4h ago.

## 2018-09-18 NOTE — ED Notes (Signed)
Pt. sats at 86% on RA. This tech placed pt. On 2L and sats are up to 98%.

## 2018-09-18 NOTE — ED Provider Notes (Signed)
[facility] Provider Note   CSN: [id number] Arrival date & time: 09/18/18  0209    History   Chief Complaint Chief Complaint  Patient presents with  . Sickle Cell Pain Crisis    HPI Joanna [name] is a 38 y.o. female.     Patient presents to the emergency department with a chief complaint of sickle cell pain.  She complains of pain in her low back and bilateral lower extremities.  She states that this is typical for her.  She has been taking her regular pain medications as prescribed.  She denies any chest pain, but states that she is always short of breath.  She was recently admitted for sickle cell pain and hypoxia, but had negative CT PE study.  She does not wear oxygen at home.  She denies any fevers or chills.  Denies any other associated symptoms.  The history is provided by the patient. No language interpreter was used.    Past Medical History:  Diagnosis Date  . Asthma   . Sickle cell anemia [facility](HCC)     Patient Active Problem List   Diagnosis Date Noted  . Hypoxia   . Community acquired pneumonia   . Sickle cell crisis (HCC) 09/28/2017  . Sickle cell anemia with pain (HCC) 09/28/2017  . Sickle cell pain crisis (HCC) 09/21/2017  . Abdominal pain 09/21/2017  . Tobacco abuse 09/21/2017    History reviewed. No pertinent surgical history.   OB History    Gravida  1   Para      Term      Preterm      AB      Living        SAB      TAB      Ectopic      Multiple      Live Births               Home Medications    Prior to Admission medications   Medication Sig Start Date End Date Taking? Authorizing Provider  albuterol (PROVENTIL HFA) 108 (90 Base) MCG/ACT inhaler Inhale 2 puffs into the lungs every 6 (six) hours as needed for wheezing.  12/20/13   [provider]  DULoxetine (CYMBALTA) 30 MG capsule Take 30-60 mg by mouth See admin instructions. Take 2 capsules (60mg ) in the morning and 1 capsule  (30mg ) in the evening. 09/04/17   [provider]  fluticasone (FLONASE) 50 MCG/ACT nasal spray Place 1 spray into the nose daily as needed for allergies.     [provider]  folic acid (FOLVITE) 1 MG tablet Take 1 mg by mouth daily.    [provider]  gabapentin (NEURONTIN) 100 MG capsule Take 100 mg by mouth 3 (three) times daily. 08/17/17   [provider]  hydroxyurea (HYDREA) 100 mg/mL SUSP Take 1,000 mg by mouth daily.     [provider]  methadone (DOLOPHINE) 5 MG tablet Take 5 mg by mouth every 12 (twelve) hours.    [provider]  naloxone [facility](NARCAN) nasal spray 4 mg/0.1 mL Place 1 spray into the nose once as needed (opiod overdose).  11/21/14   [provider]  Oxycodone HCl 10 MG TABS Take 10 mg by mouth every 4 (four) hours as needed (pain).     [provider]  promethazine (PHENERGAN) 25 MG tablet Take 1 tablet (25 mg total) by mouth every 6 (six) hours as needed for nausea or  vomiting. 11/26/17   Muthersbaugh, Dahlia ClientHannah, PA-C  risperiDONE (RISPERDAL) 2 MG tablet Take 2 mg by mouth 2 (two) times daily. 10/03/17   [provider]    Family History Family History  Problem Relation Age of Onset  . Diabetes Father     Social History Social History   Tobacco Use  . Smoking status: Current Every Day Smoker    Packs/day: 1.00  . Smokeless tobacco: Current User  Substance Use Topics  . Alcohol use: Not Currently    Comment: "every blue moon"  . Drug use: Not Currently     Allergies   Ondansetron and Metoclopramide   Review of Systems Review of Systems  All other systems reviewed and are negative.    Physical Exam Updated Vital Signs BP 118/78 (BP Location: Right Arm)   Pulse 87   Temp 98 F (36.7 C) (Oral)   Resp 19   Ht 4\' 11"  (1.499 m)   Wt 72 kg   SpO2 98%   BMI 32.06 kg/m   Physical Exam Vitals signs and nursing note reviewed.  Constitutional:      General: She is not in acute  distress.    Appearance: She is well-developed.  HENT:     Head: Normocephalic and atraumatic.  Eyes:     Conjunctiva/sclera: Conjunctivae normal.  Neck:     Musculoskeletal: Neck supple.  Cardiovascular:     Rate and Rhythm: Normal rate and regular rhythm.     Heart sounds: No murmur.  Pulmonary:     Effort: Pulmonary effort is normal. No respiratory distress.     Breath sounds: Normal breath sounds.  Abdominal:     Palpations: Abdomen is soft.     Tenderness: There is no abdominal tenderness.  Musculoskeletal: Normal range of motion.     Comments: Mild swelling of bilateral lower extremities without pitting edema  Skin:    General: Skin is warm and dry.  Neurological:     Mental Status: She is alert and oriented to person, place, and time.  Psychiatric:        Mood and Affect: Mood normal.        Behavior: Behavior normal.        Thought Content: Thought content normal.        Judgment: Judgment normal.      ED Treatments / Results  Labs (all labs ordered are listed, but only abnormal results are displayed) Labs Reviewed  RETICULOCYTES  COMPREHENSIVE METABOLIC PANEL  CBC WITH DIFFERENTIAL/PLATELET    EKG None  Radiology No results found.  Procedures Procedures (including critical care time)  Medications Ordered in ED Medications  diphenhydrAMINE (BENADRYL) capsule 25-50 mg (has no administration in time range)  promethazine (PHENERGAN) tablet 25 mg (has no administration in time range)  HYDROmorphone (DILAUDID) injection 2 mg (has no administration in time range)    Or  HYDROmorphone (DILAUDID) injection 2 mg (has no administration in time range)  HYDROmorphone (DILAUDID) injection 2 mg (has no administration in time range)    Or  HYDROmorphone (DILAUDID) injection 2 mg (has no administration in time range)  HYDROmorphone (DILAUDID) injection 2 mg (has no administration in time range)    Or  HYDROmorphone (DILAUDID) injection 2 mg (has no administration  in time range)  HYDROmorphone (DILAUDID) injection 2 mg (has no administration in time range)    Or  HYDROmorphone (DILAUDID) injection 2 mg (has no administration in time range)     Initial Impression / Assessment and Plan /  ED Course  I have reviewed the triage vital signs and the nursing notes.  Pertinent labs & imaging results that were available during my care of the patient were reviewed by me and considered in my medical decision making (see chart for details).        Patient here with her typical sickle cell type pain in her bilateral lower extremities.  Denies any fever or chest pain.  Will treat symptoms and check labs.  Labs are pretty close to her baseline.  She states that she is feeling much better and feels that she can be discharged.    I have encouraged her to return for new or worsening symptoms.  Final Clinical Impressions(s) / ED Diagnoses   Final diagnoses:  Sickle cell pain crisis [facility])    ED Discharge Orders    None       Montine Circle, PA-C 21/19/41 [id number]    Delora Fuel, MD 14/48/18 [phone]

## 2018-09-20 ENCOUNTER — Emergency Department (HOSPITAL_COMMUNITY): Payer: Medicaid Other

## 2018-09-20 ENCOUNTER — Encounter (HOSPITAL_COMMUNITY): Payer: Self-pay | Admitting: Emergency Medicine

## 2018-09-20 ENCOUNTER — Inpatient Hospital Stay (HOSPITAL_COMMUNITY)
Admission: EM | Admit: 2018-09-20 | Discharge: 2018-09-20 | DRG: 812 | Discharge status: Against Medical Advice | Payer: Medicaid Other | Attending: Internal Medicine | Admitting: Internal Medicine

## 2018-09-20 DIAGNOSIS — Z720 Tobacco use: Secondary | ICD-10-CM | POA: Diagnosis present

## 2018-09-20 DIAGNOSIS — F17210 Nicotine dependence, cigarettes, uncomplicated: Secondary | ICD-10-CM | POA: Diagnosis present

## 2018-09-20 DIAGNOSIS — R0902 Hypoxemia: Secondary | ICD-10-CM | POA: Diagnosis present

## 2018-09-20 DIAGNOSIS — Z833 Family history of diabetes mellitus: Secondary | ICD-10-CM | POA: Diagnosis not present

## 2018-09-20 DIAGNOSIS — Z5329 Procedure and treatment not carried out because of patient's decision for other reasons: Secondary | ICD-10-CM | POA: Diagnosis present

## 2018-09-20 DIAGNOSIS — D5700 Hb-SS disease with crisis, unspecified: Secondary | ICD-10-CM | POA: Diagnosis present

## 2018-09-20 LAB — COMPREHENSIVE METABOLIC PANEL
ALT: 13 U/L (ref 0–44)
AST: 26 U/L (ref 15–41)
Albumin: 3.7 g/dL (ref 3.5–5.0)
Alkaline Phosphatase: 136 U/L — ABNORMAL HIGH (ref 38–126)
Anion gap: 9 (ref 5–15)
BUN: <5 mg/dL — ABNORMAL LOW (ref 6–20)
CO2: 21 mmol/L — ABNORMAL LOW (ref 22–32)
Calcium: 8.8 mg/dL — ABNORMAL LOW (ref 8.9–10.3)
Chloride: 104 mmol/L (ref 98–111)
Creatinine, Ser: 0.55 mg/dL (ref 0.44–1.00)
GFR calc Af Amer: >60 mL/min (ref >60)
GFR calc non Af Amer: >60 mL/min (ref >60)
Glucose, Bld: 105 mg/dL — ABNORMAL HIGH (ref 70–99)
Potassium: 4 mmol/L (ref 3.5–5.1)
Sodium: 134 mmol/L — ABNORMAL LOW (ref 135–145)
Total Bilirubin: 3.1 mg/dL — ABNORMAL HIGH (ref 0.3–1.2)
Total Protein: 6.6 g/dL (ref 6.5–8.1)

## 2018-09-20 LAB — CBC WITH DIFFERENTIAL/PLATELET
Abs Immature Granulocytes: 0.26 10*3/uL — ABNORMAL HIGH (ref 0.00–0.07)
Basophils Absolute: 0.1 10*3/uL (ref 0.0–0.1)
Basophils Relative: 1 %
Eosinophils Absolute: 0.5 10*3/uL (ref 0.0–0.5)
Eosinophils Relative: 2 %
HCT: 23.1 % — ABNORMAL LOW (ref 36.0–46.0)
Hemoglobin: 8.1 g/dL — ABNORMAL LOW (ref 12.0–15.0)
Immature Granulocytes: 1 %
Lymphocytes Relative: 23 %
Lymphs Abs: 4.7 10*3/uL — ABNORMAL HIGH (ref 0.7–4.0)
MCH: 33.3 pg (ref 26.0–34.0)
MCHC: 35.1 g/dL (ref 30.0–36.0)
MCV: 95.1 fL (ref 80.0–100.0)
Monocytes Absolute: 1.6 10*3/uL — ABNORMAL HIGH (ref 0.1–1.0)
Monocytes Relative: 8 %
Neutro Abs: 13.1 10*3/uL — ABNORMAL HIGH (ref 1.7–7.7)
Neutrophils Relative %: 65 %
Platelets: 521 10*3/uL — ABNORMAL HIGH (ref 150–400)
RBC: 2.43 MIL/uL — ABNORMAL LOW (ref 3.87–5.11)
RDW: 27.8 % — ABNORMAL HIGH (ref 11.5–15.5)
WBC: 20.2 10*3/uL — ABNORMAL HIGH (ref 4.0–10.5)
nRBC: 9.8 % — ABNORMAL HIGH (ref 0.0–0.2)

## 2018-09-20 LAB — RETICULOCYTES
Immature Retic Fract: 29.7 % — ABNORMAL HIGH (ref 2.3–15.9)
RBC.: 2.4 MIL/uL — ABNORMAL LOW (ref 3.87–5.11)
Retic Ct Pct: >30 % — ABNORMAL HIGH (ref 0.4–3.1)

## 2018-09-20 LAB — I-STAT BETA HCG BLOOD, ED (MC, WL, AP ONLY): I-stat hCG, quantitative: <5 m[IU]/mL (ref <5)

## 2018-09-20 MED ORDER — HYDROMORPHONE HCL 1 MG/ML IJ SOLN: 2.0000 mg | INTRAMUSCULAR | Status: AC

## 2018-09-20 MED ORDER — HYDROMORPHONE HCL 1 MG/ML IJ SOLN
2.0000 mg | INTRAMUSCULAR | Status: AC
  Administered 2018-09-20: 2 mg via INTRAVENOUS
  Filled 2018-09-20: qty 2

## 2018-09-20 MED ORDER — DIPHENHYDRAMINE HCL 50 MG/ML IJ SOLN
25.0000 mg | Freq: Once | INTRAMUSCULAR | Status: AC
  Administered 2018-09-20: 06:00:00 25 mg via INTRAVENOUS
  Filled 2018-09-20: qty 1

## 2018-09-20 MED ORDER — SODIUM CHLORIDE 0.9% FLUSH: 10.0000 mL | INTRAVENOUS | Status: DC | PRN

## 2018-09-20 MED ORDER — PROMETHAZINE HCL 25 MG PO TABS
25.0000 mg | ORAL_TABLET | ORAL | Status: DC | PRN
  Administered 2018-09-20: 07:00:00 25 mg via ORAL
  Filled 2018-09-20: qty 1

## 2018-09-20 MED ORDER — HYDROMORPHONE HCL 1 MG/ML IJ SOLN
2.0000 mg | INTRAMUSCULAR | Status: AC
  Administered 2018-09-20: 07:00:00 2 mg via INTRAVENOUS
  Filled 2018-09-20: qty 2

## 2018-09-20 MED ORDER — HEPARIN SOD (PORK) LOCK FLUSH 100 UNIT/ML IV SOLN
500.0000 [IU] | INTRAVENOUS | Status: AC | PRN
  Administered 2018-09-20: 13:00:00 500 [IU]

## 2018-09-20 MED ORDER — HYDROMORPHONE HCL 1 MG/ML IJ SOLN
2.0000 mg | INTRAMUSCULAR | Status: AC
  Administered 2018-09-20: 06:00:00 2 mg via INTRAVENOUS
  Filled 2018-09-20: qty 2

## 2018-09-20 NOTE — ED Triage Notes (Signed)
Pt c/o sickle cell type pain to arms, legs and back. Pt tearful stating she only usually has pain in arms and legs.  Pt reports she was seen here recently for same but has not been in "crisis" in a long time. Last Oxycodone 5 4 hours ago, last Methadone 6 hours ago.

## 2018-09-20 NOTE — ED Notes (Signed)
Patient transported to X-ray 

## 2018-09-20 NOTE — ED Notes (Addendum)
2L O2 applied via [place] as pt's O2 sat was 88% on RA. Improvement in O2 sat to 100% on 2L O2.

## 2018-09-20 NOTE — Progress Notes (Signed)
Joanna [name], a 38 year old female with a medical history significant for sickle cell disease, chronic pain syndrome, opiate dependence, tobacco dependence, mild intermittent asthma, history of hypoxia, and schizophrenia presented to ER with complaints of increasing pain.  Patient was found to have oxygen saturation between 79-80% on RA.  Oxygen saturation improved on 2 L.  Notified for inpatient admission for hypoxia in the presence of sickle cell pain crisis.  On arrival to ER, patient had  left AGAINST MEDICAL ADVICE prior to assessment.  Donia Pounds  APRN, MSN, FNP-C Patient [place] [address] [place], [place] [address] [phone]

## 2018-09-20 NOTE — ED Notes (Signed)
Pt seen walking out to waiting room, port already deaccessed.

## 2018-09-20 NOTE — ED Provider Notes (Cosign Needed Addendum)
  Physical Exam  BP 110/69   Pulse 74   Temp 98.6 F (37 C) (Oral)   Resp 12   Ht 4\' 11"  (1.499 m)   Wt 68 kg   SpO2 96%   BMI 30.30 kg/m   Physical Exam  ED Course/Procedures     Procedures  MDM  Patient received from [facility], please see her note for a full HPI. Briefly, patient with PMH of sickle cell, presenting with sickle cell pain. She was given multiple rounds of subcutaneous Dilaudid, RN noted patient to be satting in the 88's or room air, she was placed on O2 to 2 L which brought her up to 100%. Laboratory results were evaluated, slight hyponatremia.  Otherwise CMP is unremarkable.  Reticulocytes are consistent with patient's previous labs.  Beta hCG is negative.  Patient was reevaluated by me upon entering the room patient is sleeping, was woken up taken off oxygen.  I have advised patient that all her labs have returned and they are within normal limits.  Patient did voice that she did not want to get a chest x-ray as she is gone multiple x-rays in the past.  I will cancel this at patient's request.  EKG showed no changes.  At this time will ambulate patient with pulse ox, in order to further evaluate for any hypoxia.  Patient does have long acrylic nails attached to her fingers, will have them monitor oxygen on earlobe.  11:49 AM Patient was ambulate by me personally, oxygen is at 85% on RA with a HR of 110, denies any chest pain or shortness of breath. Due to patients persistent hypoxia will place for for hospitalist admission. Spoke to [place] [facility] who will transfer [facility] to [facility] care. Patient did not have CBC ordered by previous provider I have placed this order at this time. Patient to be admitted for further evaluation of hypoxia.   12:30 PM after explaining to patient that she will be admitted for further evaluation of her hypoxia as she reports she cannot stay as she does not get care on the [facility] health system, she is requesting to  leave [place].  Unable to further complete her treatment.    Portions of this note were generated with [employer]. Dictation errors may occur despite best attempts at proofreading.        Janeece Fitting, PA-C 09/20/18 1158    [address], PA-C 09/20/18 1159    Janeece Fitting, PA-C 09/20/18 1230

## 2018-09-20 NOTE — ED Notes (Signed)
Pt requesting labs be drawn when port accessed, will place orders when patient given room assignment

## 2018-09-20 NOTE — ED Provider Notes (Signed)
[place] EMERGENCY DEPARTMENT Provider Note   CSN: [id number] Arrival date & time: 09/20/18  0242  History   Chief Complaint Chief Complaint  Patient presents with  . Sickle Cell Pain Crisis    HPI Joanna [name] is a 38 y.o. female with past medical history significant for sickle cell anemia who presents for evaluation of sickle cell pain.  Patient states she has had pain located to her bilateral arms and bilateral legs x4 days.  Was seen in ED 2 days ago with similar complaints.  Patient states she thought her pain was manageable at that time however since she returned home she is continued to have pain.  She denies prior history of PE or DVT.  She denies any chest pain or shortness of breath.  No known COVID a positive exposures.  She has not had any swelling, redness, warmth or tenderness to her joints.  He rates her pain a 9/10.  Pain does not radiate.  Has been taking her home oxycodone in the home methadone without relief of her pain.  Symptoms constant since onset.  History obtained from patient.  No interpreter was used.     HPI  Past Medical History:  Diagnosis Date  . Asthma   . Sickle cell anemia [facility])     Patient Active Problem List   Diagnosis Date Noted  . Hypoxia   . Community acquired pneumonia   . Sickle cell crisis ([place]) 09/28/2017  . Sickle cell anemia with pain ([place]) 09/28/2017  . Sickle cell pain crisis ([place]) 09/21/2017  . Abdominal pain 09/21/2017  . Tobacco abuse 09/21/2017    History reviewed. No pertinent surgical history.   OB History    Gravida  1   Para      Term      Preterm      AB      Living        SAB      TAB      Ectopic      Multiple      Live Births               Home Medications    Prior to Admission medications   Medication Sig Start Date End Date Taking? Authorizing Provider  albuterol (PROVENTIL HFA) 108 (90 Base) MCG/ACT inhaler Inhale 2 puffs into the lungs every 6 (six) hours as  needed for wheezing.  12/20/13   [provider]  DULoxetine (CYMBALTA) 30 MG capsule Take 30-60 mg by mouth See admin instructions. Take 2 capsules (60mg ) in the morning and 1 capsule (30mg ) in the evening. 09/04/17   [provider]  fluticasone (FLONASE) 50 MCG/ACT nasal spray Place 1 spray into the nose daily as needed for allergies.     [provider]  folic acid (FOLVITE) 1 MG tablet Take 1 mg by mouth daily.    [provider]  gabapentin (NEURONTIN) 100 MG capsule Take 100 mg by mouth 3 (three) times daily. 08/17/17   [provider]  hydroxyurea (HYDREA) 100 mg/mL SUSP Take 1,000 mg by mouth daily.     [provider]  methadone (DOLOPHINE) 5 MG tablet Take 5 mg by mouth every 12 (twelve) hours.    [provider]  naloxone [facility]) nasal spray 4 mg/0.1 mL Place 1 spray into the nose once as needed (opiod overdose).  11/21/14   [provider]  Oxycodone HCl 10 MG TABS Take 10 mg by mouth every 4 (  four) hours as needed (pain).     [provider]  promethazine (PHENERGAN) 25 MG tablet Take 1 tablet (25 mg total) by mouth every 6 (six) hours as needed for nausea or vomiting. 11/26/17   Muthersbaugh, Dahlia ClientHannah, PA-C  risperiDONE (RISPERDAL) 2 MG tablet Take 2 mg by mouth 2 (two) times daily. 10/03/17   [provider]    Family History Family History  Problem Relation Age of Onset  . Diabetes Father     Social History Social History   Tobacco Use  . Smoking status: Current Every Day Smoker    Packs/day: 1.00  . Smokeless tobacco: Current User  Substance Use Topics  . Alcohol use: Not Currently    Comment: "every blue moon"  . Drug use: Not Currently     Allergies   Ondansetron and Metoclopramide   Review of Systems Review of Systems  Constitutional: Negative.   HENT: Negative.   Respiratory: Negative.   Cardiovascular: Negative.   Gastrointestinal: Negative.   Genitourinary: Negative.    Musculoskeletal: Positive for myalgias. Negative for arthralgias, back pain, gait problem, joint swelling, neck pain and neck stiffness.  Skin: Negative.   Neurological: Negative.   All other systems reviewed and are negative.    Physical Exam Updated Vital Signs BP 111/82   Pulse 81   Temp 98.6 F (37 C) (Oral)   Resp 14   Ht 4\' 11"  (1.499 m)   Wt 68 kg   SpO2 97%   BMI 30.30 kg/m   Physical Exam Vitals signs and nursing note reviewed.  Constitutional:      General: She is not in acute distress.    Appearance: She is well-developed. She is not ill-appearing, toxic-appearing or diaphoretic.  HENT:     Head: Normocephalic and atraumatic.     Nose: Nose normal.     Mouth/Throat:     Pharynx: Oropharynx is clear.  Eyes:     Pupils: Pupils are equal, round, and reactive to light.  Neck:     Musculoskeletal: Normal range of motion.  Cardiovascular:     Rate and Rhythm: Normal rate.     Pulses: Normal pulses.     Heart sounds: Normal heart sounds.  Pulmonary:     Effort: Pulmonary effort is normal. No respiratory distress.     Breath sounds: Normal breath sounds. No stridor. No wheezing, rhonchi or rales.  Abdominal:     General: Bowel sounds are normal. There is no distension.     Tenderness: There is no abdominal tenderness. There is no guarding or rebound.  Musculoskeletal: Normal range of motion.     Comments: Moves all 4 extremities without difficulty.  No lower extremity edema, erythema, ecchymosis or warmth.  No tenderness of bilateral calves or joints.  Skin:    General: Skin is warm and dry.     Comments: No rashes or lesions.  Brisk capillary refill.  Neurological:     Mental Status: She is alert.     Comments: 5/5 strength bilateral lower extremities.  Ambulatory in ED without difficulty.      ED Treatments / Results  Labs (all labs ordered are listed, but only abnormal results are displayed) Labs Reviewed  COMPREHENSIVE METABOLIC PANEL - Abnormal;  Notable for the following components:      Result Value   Sodium 134 (*)    CO2 21 (*)    Glucose, Bld 105 (*)    BUN <5 (*)    Calcium 8.8 (*)  Alkaline Phosphatase 136 (*)    Total Bilirubin 3.1 (*)    All other components within normal limits  RETICULOCYTES  I-STAT BETA HCG BLOOD, ED ([facility], [facility], [facility] ONLY)    EKG EKG Interpretation  Date/Time:  Monday September 20 2018 06:13:26 EDT Ventricular Rate:  76 PR Interval:    QRS Duration: 104 QT Interval:  452 QTC Calculation: 509 R Axis:   20 Text Interpretation:  Sinus rhythm Borderline T abnormalities, anterior leads Borderline prolonged QT interval When compared with ECG of 09/08/2018, No significant change was found Confirmed by Dione BoozeGlick, David ([id number]) on 09/20/2018 6:34:33 AM   Radiology No results found.  Procedures Procedures (including critical care time)  Medications Ordered in ED Medications  sodium chloride flush (NS) 0.9 % injection 10-40 mL (has no administration in time range)  promethazine (PHENERGAN) tablet 25 mg (25 mg Oral Given 09/20/18 0701)  HYDROmorphone (DILAUDID) injection 2 mg (2 mg Intravenous Given 09/20/18 0556)    Or  HYDROmorphone (DILAUDID) injection 2 mg ( Subcutaneous See Alternative 09/20/18 0556)  HYDROmorphone (DILAUDID) injection 2 mg (2 mg Intravenous Given 09/20/18 0631)    Or  HYDROmorphone (DILAUDID) injection 2 mg ( Subcutaneous See Alternative 09/20/18 0631)  HYDROmorphone (DILAUDID) injection 2 mg (2 mg Intravenous Given 09/20/18 0701)    Or  HYDROmorphone (DILAUDID) injection 2 mg ( Subcutaneous See Alternative 09/20/18 0701)  diphenhydrAMINE (BENADRYL) injection 25 mg (25 mg Intravenous Given 09/20/18 0557)     Initial Impression / Assessment and Plan / ED Course  I have reviewed the triage vital signs and the nursing notes.  Pertinent labs & imaging results that were available during my care of the patient were reviewed by me and considered in my medical decision making (see chart for details).   38 year old female appears otherwise well presents for evaluation of sickle cell pain.  Afebrile, nonseptic, non-ill-appearing.  Normal musculoskeletal exam.  Neurovascularly intact.  Pain similar to previous sickle cell crises.  She has no shortness of breath, chest pain or hypoxia, low suspicion for acute chest syndrome.  She has no tenderness to her joints, redness, swelling or warmth.  I have low suspicion for septic arthritis.  No unilateral extremity swelling.  I have low suspicion for VTE.  Will obtain labs and provide pain management and reevaluate.  On reevaluation nursing states that patient with oxygen at 88% on RA. Continues to deny CP, SOB. Will obtain chest xray and EKG.   ON reevaluation patient sleep quietly. O2 100% on 2 L nasal cannula. Will trial off O2 and reevaluate. If continues to be hypoxic will need reassessment to determine if need D-Dimer to assess for PE as cause of hypoxia.  [facility] who will determine ultimate treatment, plan and disposition. Labs, imaging pending at care transfer.      Final Clinical Impressions(s) / ED Diagnoses   Final diagnoses:  Sickle cell pain crisis [facility](HCC)    ED Discharge Orders    None       Tiasha Helvie A, PA-C 09/20/18 0716    Dione BoozeGlick, David, MD 09/20/18 [phone]

## 2018-09-20 NOTE — ED Notes (Addendum)
Pt's O2 sats noted to be 79, 80% on RA while sedentary; pt placed on 3L, sats 96%; pt noted to be lethargic, responsive to voice but quick to fall asleep ; EDP [place] aware

## 2018-09-22 ENCOUNTER — Inpatient Hospital Stay (HOSPITAL_COMMUNITY)
Admission: EM | Admit: 2018-09-22 | Discharge: 2018-09-24 | DRG: 811 | Disposition: A | Discharge status: Home / Self Care | Payer: Medicaid Other | Attending: Internal Medicine | Admitting: Internal Medicine

## 2018-09-22 ENCOUNTER — Other Ambulatory Visit: Payer: Self-pay

## 2018-09-22 ENCOUNTER — Emergency Department (HOSPITAL_COMMUNITY): Payer: Medicaid Other

## 2018-09-22 ENCOUNTER — Encounter (HOSPITAL_COMMUNITY): Payer: Self-pay | Admitting: *Deleted

## 2018-09-22 DIAGNOSIS — G894 Chronic pain syndrome: Secondary | ICD-10-CM

## 2018-09-22 DIAGNOSIS — F209 Schizophrenia, unspecified: Secondary | ICD-10-CM | POA: Diagnosis present

## 2018-09-22 DIAGNOSIS — F1120 Opioid dependence, uncomplicated: Secondary | ICD-10-CM | POA: Diagnosis present

## 2018-09-22 DIAGNOSIS — D72829 Elevated white blood cell count, unspecified: Secondary | ICD-10-CM | POA: Diagnosis present

## 2018-09-22 DIAGNOSIS — D5700 Hb-SS disease with crisis, unspecified: Principal | ICD-10-CM | POA: Diagnosis present

## 2018-09-22 DIAGNOSIS — Z20828 Contact with and (suspected) exposure to other viral communicable diseases: Secondary | ICD-10-CM | POA: Diagnosis present

## 2018-09-22 DIAGNOSIS — I2694 Multiple subsegmental pulmonary emboli without acute cor pulmonale: Secondary | ICD-10-CM | POA: Diagnosis present

## 2018-09-22 DIAGNOSIS — F17210 Nicotine dependence, cigarettes, uncomplicated: Secondary | ICD-10-CM | POA: Diagnosis present

## 2018-09-22 DIAGNOSIS — I2699 Other pulmonary embolism without acute cor pulmonale: Secondary | ICD-10-CM | POA: Diagnosis present

## 2018-09-22 DIAGNOSIS — Z833 Family history of diabetes mellitus: Secondary | ICD-10-CM | POA: Diagnosis not present

## 2018-09-22 DIAGNOSIS — E876 Hypokalemia: Secondary | ICD-10-CM | POA: Diagnosis present

## 2018-09-22 DIAGNOSIS — R0902 Hypoxemia: Secondary | ICD-10-CM | POA: Diagnosis not present

## 2018-09-22 DIAGNOSIS — Z720 Tobacco use: Secondary | ICD-10-CM | POA: Diagnosis present

## 2018-09-22 DIAGNOSIS — J4520 Mild intermittent asthma, uncomplicated: Secondary | ICD-10-CM | POA: Diagnosis present

## 2018-09-22 DIAGNOSIS — I2693 Single subsegmental pulmonary embolism without acute cor pulmonale: Secondary | ICD-10-CM | POA: Diagnosis not present

## 2018-09-22 LAB — BASIC METABOLIC PANEL
Anion gap: 9 (ref 5–15)
BUN: 6 mg/dL (ref 6–20)
CO2: 22 mmol/L (ref 22–32)
Calcium: 8.5 mg/dL — ABNORMAL LOW (ref 8.9–10.3)
Chloride: 106 mmol/L (ref 98–111)
Creatinine, Ser: 0.54 mg/dL (ref 0.44–1.00)
GFR calc Af Amer: >60 mL/min (ref >60)
GFR calc non Af Amer: >60 mL/min (ref >60)
Glucose, Bld: 113 mg/dL — ABNORMAL HIGH (ref 70–99)
Potassium: 3.1 mmol/L — ABNORMAL LOW (ref 3.5–5.1)
Sodium: 137 mmol/L (ref 135–145)

## 2018-09-22 LAB — CBC WITH DIFFERENTIAL/PLATELET
Abs Immature Granulocytes: 0.16 10*3/uL — ABNORMAL HIGH (ref 0.00–0.07)
Basophils Absolute: 0.1 10*3/uL (ref 0.0–0.1)
Basophils Relative: 0 %
Eosinophils Absolute: 0.4 10*3/uL (ref 0.0–0.5)
Eosinophils Relative: 2 %
HCT: 24.7 % — ABNORMAL LOW (ref 36.0–46.0)
Hemoglobin: 8.5 g/dL — ABNORMAL LOW (ref 12.0–15.0)
Immature Granulocytes: 1 %
Lymphocytes Relative: 26 %
Lymphs Abs: 4.7 10*3/uL — ABNORMAL HIGH (ref 0.7–4.0)
MCH: 32.9 pg (ref 26.0–34.0)
MCHC: 34.4 g/dL (ref 30.0–36.0)
MCV: 95.7 fL (ref 80.0–100.0)
Monocytes Absolute: 1.8 10*3/uL — ABNORMAL HIGH (ref 0.1–1.0)
Monocytes Relative: 10 %
Neutro Abs: 10.9 10*3/uL — ABNORMAL HIGH (ref 1.7–7.7)
Neutrophils Relative %: 61 %
Platelets: 422 10*3/uL — ABNORMAL HIGH (ref 150–400)
RBC: 2.58 MIL/uL — ABNORMAL LOW (ref 3.87–5.11)
RDW: 26.6 % — ABNORMAL HIGH (ref 11.5–15.5)
WBC: 18 10*3/uL — ABNORMAL HIGH (ref 4.0–10.5)
nRBC: 6.4 % — ABNORMAL HIGH (ref 0.0–0.2)

## 2018-09-22 LAB — RETICULOCYTES
Immature Retic Fract: 7.7 % (ref 2.3–15.9)
RBC.: 2.58 MIL/uL — ABNORMAL LOW (ref 3.87–5.11)
Retic Ct Pct: >30 % — ABNORMAL HIGH (ref 0.4–3.1)

## 2018-09-22 LAB — HEPARIN LEVEL (UNFRACTIONATED)
Heparin Unfractionated: 0.3 IU/mL (ref 0.30–0.70)
Heparin Unfractionated: 0.38 IU/mL (ref 0.30–0.70)

## 2018-09-22 LAB — PROTIME-INR
INR: 1.1 (ref 0.8–1.2)
Prothrombin Time: 14.1 seconds (ref 11.4–15.2)

## 2018-09-22 LAB — APTT: aPTT: 30 seconds (ref 24–36)

## 2018-09-22 LAB — I-STAT BETA HCG BLOOD, ED (MC, WL, AP ONLY): I-stat hCG, quantitative: <5 m[IU]/mL (ref <5)

## 2018-09-22 LAB — SARS CORONAVIRUS 2 BY RT PCR (HOSPITAL ORDER, PERFORMED IN ~~LOC~~ HOSPITAL LAB): SARS Coronavirus 2: NEGATIVE

## 2018-09-22 MED ORDER — DULOXETINE HCL 30 MG PO CPEP
30.0000 mg | ORAL_CAPSULE | Freq: Every day | ORAL | Status: DC
  Administered 2018-09-22 – 2018-09-23 (×2): 30 mg via ORAL
  Filled 2018-09-22 (×2): qty 1

## 2018-09-22 MED ORDER — NALOXONE HCL 0.4 MG/ML IJ SOLN: 0.4000 mg | INTRAMUSCULAR | Status: DC | PRN

## 2018-09-22 MED ORDER — PROMETHAZINE HCL 25 MG PO TABS: 25.0000 mg | ORAL_TABLET | ORAL | Status: DC | PRN

## 2018-09-22 MED ORDER — HYDROMORPHONE HCL 2 MG/ML IJ SOLN: 2.0000 mg | INTRAMUSCULAR | Status: AC

## 2018-09-22 MED ORDER — HYDROMORPHONE HCL 2 MG/ML IJ SOLN
2.0000 mg | INTRAMUSCULAR | Status: AC
  Administered 2018-09-22: 2 mg via INTRAVENOUS
  Filled 2018-09-22: qty 1

## 2018-09-22 MED ORDER — HYDROMORPHONE 1 MG/ML IV SOLN
INTRAVENOUS | Status: DC
  Administered 2018-09-22: 30 mg via INTRAVENOUS
  Administered 2018-09-22: 8 mg via INTRAVENOUS
  Administered 2018-09-23: 2.5 mg via INTRAVENOUS
  Administered 2018-09-23: 2 mg via INTRAVENOUS
  Administered 2018-09-23: 0.5 mg via INTRAVENOUS
  Administered 2018-09-23: 2.5 mg via INTRAVENOUS
  Administered 2018-09-24: 2 mg via INTRAVENOUS
  Administered 2018-09-24: 1.5 mg via INTRAVENOUS
  Administered 2018-09-24: 0.5 mg via INTRAVENOUS
  Filled 2018-09-22 (×2): qty 30

## 2018-09-22 MED ORDER — ALBUTEROL SULFATE (2.5 MG/3ML) 0.083% IN NEBU: 2.5000 mg | INHALATION_SOLUTION | Freq: Four times a day (QID) | RESPIRATORY_TRACT | Status: DC | PRN

## 2018-09-22 MED ORDER — RISPERIDONE 1 MG PO TABS
2.0000 mg | ORAL_TABLET | Freq: Two times a day (BID) | ORAL | Status: DC
  Administered 2018-09-22 – 2018-09-24 (×4): 2 mg via ORAL
  Filled 2018-09-22 (×4): qty 2

## 2018-09-22 MED ORDER — IOHEXOL 350 MG/ML SOLN
100.0000 mL | Freq: Once | INTRAVENOUS | Status: AC | PRN
  Administered 2018-09-22: 100 mL via INTRAVENOUS

## 2018-09-22 MED ORDER — GABAPENTIN 100 MG PO CAPS
100.0000 mg | ORAL_CAPSULE | Freq: Three times a day (TID) | ORAL | Status: DC
  Administered 2018-09-22 – 2018-09-24 (×6): 100 mg via ORAL
  Filled 2018-09-22 (×6): qty 1

## 2018-09-22 MED ORDER — POLYETHYLENE GLYCOL 3350 17 G PO PACK: 17.0000 g | PACK | Freq: Every day | ORAL | Status: DC | PRN

## 2018-09-22 MED ORDER — HYDROMORPHONE HCL 2 MG/ML IJ SOLN
2.0000 mg | Freq: Once | INTRAMUSCULAR | Status: AC
  Administered 2018-09-22: 2 mg via INTRAVENOUS
  Filled 2018-09-22: qty 1

## 2018-09-22 MED ORDER — DIPHENHYDRAMINE HCL 50 MG/ML IJ SOLN
25.0000 mg | Freq: Once | INTRAMUSCULAR | Status: AC
  Administered 2018-09-22: 25 mg via INTRAVENOUS
  Filled 2018-09-22: qty 1

## 2018-09-22 MED ORDER — SODIUM CHLORIDE 0.9% FLUSH: 9.0000 mL | INTRAVENOUS | Status: DC | PRN

## 2018-09-22 MED ORDER — DULOXETINE HCL 60 MG PO CPEP
60.0000 mg | ORAL_CAPSULE | Freq: Every day | ORAL | Status: DC
  Administered 2018-09-23 – 2018-09-24 (×2): 60 mg via ORAL
  Filled 2018-09-22 (×2): qty 1

## 2018-09-22 MED ORDER — HEPARIN (PORCINE) 25000 UT/250ML-% IV SOLN
1000.0000 [IU]/h | INTRAVENOUS | Status: DC
  Administered 2018-09-22 – 2018-09-23 (×2): 1000 [IU]/h via INTRAVENOUS
  Filled 2018-09-22 (×2): qty 250

## 2018-09-22 MED ORDER — SODIUM CHLORIDE 0.45 % IV SOLN
INTRAVENOUS | Status: DC
  Administered 2018-09-22 – 2018-09-24 (×3): via INTRAVENOUS

## 2018-09-22 MED ORDER — SODIUM CHLORIDE (PF) 0.9 % IJ SOLN
INTRAMUSCULAR | Status: AC
  Administered 2018-09-22: 10 mL
  Filled 2018-09-22: qty 50

## 2018-09-22 MED ORDER — FLUTICASONE PROPIONATE 50 MCG/ACT NA SUSP
1.0000 | Freq: Every day | NASAL | Status: DC
  Administered 2018-09-23 – 2018-09-24 (×2): 1 via NASAL
  Filled 2018-09-22: qty 16

## 2018-09-22 MED ORDER — HYDROMORPHONE HCL 2 MG/ML IJ SOLN
2.0000 mg | INTRAMUSCULAR | Status: AC
  Administered 2018-09-22: 2 mg via INTRAVENOUS
  Filled 2018-09-22 (×2): qty 1

## 2018-09-22 MED ORDER — KETOROLAC TROMETHAMINE 15 MG/ML IJ SOLN
15.0000 mg | Freq: Four times a day (QID) | INTRAMUSCULAR | Status: DC
  Administered 2018-09-22 – 2018-09-24 (×7): 15 mg via INTRAVENOUS
  Filled 2018-09-22 (×7): qty 1

## 2018-09-22 MED ORDER — DULOXETINE HCL 30 MG PO CPEP: 30.0000 mg | ORAL_CAPSULE | ORAL | Status: DC

## 2018-09-22 MED ORDER — HYDROMORPHONE HCL 2 MG/ML IJ SOLN
2.0000 mg | INTRAMUSCULAR | Status: DC | PRN
  Administered 2018-09-22 (×2): 2 mg via INTRAVENOUS
  Filled 2018-09-22 (×2): qty 1

## 2018-09-22 MED ORDER — HYDROXYUREA 500 MG PO CAPS
1000.0000 mg | ORAL_CAPSULE | Freq: Every day | ORAL | Status: DC
  Administered 2018-09-23 – 2018-09-24 (×2): 1000 mg via ORAL
  Filled 2018-09-22 (×2): qty 2

## 2018-09-22 MED ORDER — HYDROMORPHONE HCL 2 MG/ML IJ SOLN
2.0000 mg | INTRAMUSCULAR | Status: AC
  Administered 2018-09-22: 2 mg via INTRAVENOUS

## 2018-09-22 MED ORDER — HEPARIN BOLUS VIA INFUSION
3000.0000 [IU] | Freq: Once | INTRAVENOUS | Status: AC
  Administered 2018-09-22: 3000 [IU] via INTRAVENOUS
  Filled 2018-09-22: qty 3000

## 2018-09-22 MED ORDER — ALBUTEROL SULFATE HFA 108 (90 BASE) MCG/ACT IN AERS: 2.0000 | INHALATION_SPRAY | Freq: Four times a day (QID) | RESPIRATORY_TRACT | Status: DC | PRN

## 2018-09-22 MED ORDER — DIPHENHYDRAMINE HCL 25 MG PO CAPS: 25.0000 mg | ORAL_CAPSULE | ORAL | Status: DC | PRN

## 2018-09-22 MED ORDER — HYDROXYUREA 100 MG/ML ORAL SUSPENSION: 1000.0000 mg | Freq: Every day | ORAL | Status: DC

## 2018-09-22 MED ORDER — METHADONE HCL 5 MG PO TABS
5.0000 mg | ORAL_TABLET | Freq: Two times a day (BID) | ORAL | Status: DC
  Administered 2018-09-22 – 2018-09-24 (×4): 5 mg via ORAL
  Filled 2018-09-22 (×4): qty 1

## 2018-09-22 MED ORDER — FOLIC ACID 1 MG PO TABS
1.0000 mg | ORAL_TABLET | Freq: Every day | ORAL | Status: DC
  Administered 2018-09-22 – 2018-09-24 (×3): 1 mg via ORAL
  Filled 2018-09-22 (×3): qty 1

## 2018-09-22 MED ORDER — SENNOSIDES-DOCUSATE SODIUM 8.6-50 MG PO TABS
1.0000 | ORAL_TABLET | Freq: Two times a day (BID) | ORAL | Status: DC
  Administered 2018-09-22 – 2018-09-24 (×4): 1 via ORAL
  Filled 2018-09-22 (×4): qty 1

## 2018-09-22 NOTE — H&P (Signed)
H&P  Patient Demographics:  Joanna [name], is a 38 y.o. female  MRN: [id number]   DOB - [birth year]  Admit Date - 09/22/2018  Outpatient Primary MD for the patient is Patient, No Pcp Per  Chief Complaint  Patient presents with  . Sickle Cell Pain Crisis      HPI:   Joanna [name]  is a 38 y.o. female with a medical history significant for sickle cell disease, chronic pain syndrome, opiate dependence, tobacco dependence, mild intermittent asthma, history of hypoxia, and schizophrenia presented to ER with complaints of increasing upper and lower extremity pain.  Patient was treated and evaluated in the ER on 09/20/2018 for this problem, but left AGAINST MEDICAL ADVICE prior to admission.  Patient states that she had a sudden increase in pain late last night that was unrelieved by home medications.  Patient is typically on methadone 5 mg every 12 hours and oxycodone.  She endorses some shortness of breath.  Current pain intensity 7/10 characterized as constant and throbbing primarily to upper and lower extremities.  Patient denies dizziness, chest pain, headache, blurred vision, nausea, vomiting, or diarrhea.  She also denies sick contacts, recent travel, or exposure to COVID-19.  ER course: WBCs 18.0, improved from previous.  Hemoglobin 8.5, platelets 422, potassium 3.1, and creatinine 0.54.  Patient underwent CT angiogram, which showed filling defects in the right upper lobar pulmonary artery and segmental branch of the left lower lobe.  The findings suggest pulmonary embolism.  Also, enlarged pulmonary artery compatible with chronic pulmonary arterial hypertension.  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  Initially, oxygen saturation 89% on RA, improved to 94-100% on 2 L supplemental oxygen.  Patient afebrile.  COVID-19 test negative.  Man[place] Pain persists despite multiple doses of IV Dilaudid, IV fluids, and IV Toradol.  Patient admitted to telemetry for sickle cell pain  crisis and pulmonary embolism.   Review of systems:  Review of Systems  Constitutional: Negative for chills and fever.  HENT: Negative.   Eyes: Negative.   Respiratory: Positive for shortness of breath. Negative for sputum production.   Cardiovascular: Negative for chest pain and palpitations.  Gastrointestinal: Negative.  Negative for heartburn, nausea and vomiting.  Genitourinary: Negative for dysuria and urgency.  Musculoskeletal: Positive for back pain and joint pain.  Skin: Positive for itching.  Neurological: Negative.  Negative for dizziness and headaches.  Psychiatric/Behavioral: Negative for depression and suicidal ideas. The patient is not nervous/anxious.     A full 10 point Review of Systems was done, except as stated above, all other Review of Systems were negative.  With Past History of the following :   Past Medical History:  Diagnosis Date  . Asthma   . Sickle cell anemia (HCC)       History reviewed. No pertinent surgical history.   Social History:   Social History   Tobacco Use  . Smoking status: Current Every Day Smoker    Packs/day: 1.00  . Smokeless tobacco: Current User  Substance Use Topics  . Alcohol use: Not Currently    Comment: "every blue moon"     Lives - At home   Family History :   Family History  Problem Relation Age of Onset  . Diabetes Father      Home Medications:   Prior to Admission medications   Medication Sig Start Date End Date Taking? Authorizing Provider  albuterol (PROVENTIL HFA) 108 (90 Base) MCG/ACT inhaler Inhale 2 puffs into the lungs every  6 (six) hours as needed for wheezing.  12/20/13  Yes [provider]  DULoxetine (CYMBALTA) 30 MG capsule Take 30-60 mg by mouth See admin instructions. Take 2 capsules (60mg ) in the morning and 1 capsule (30mg ) in the evening. 09/04/17  Yes [provider]  fluticasone (FLONASE) 50 MCG/ACT nasal spray Place 1 spray into the nose daily as needed for allergies.     Yes [provider]  folic acid (FOLVITE) 1 MG tablet Take 1 mg by mouth daily.   Yes [provider]  gabapentin (NEURONTIN) 100 MG capsule Take 100 mg by mouth 3 (three) times daily. 08/17/17  Yes [provider]  hydroxyurea (HYDREA) 100 mg/mL SUSP Take 1,000 mg by mouth daily.    Yes [provider]  methadone (DOLOPHINE) 5 MG tablet Take 5 mg by mouth every 12 (twelve) hours.   Yes [provider]  naloxone (NARCAN) nasal spray 4 mg/0.1 mL Place 1 spray into the nose once as needed (opiod overdose).  11/21/14  Yes [provider]  Oxycodone HCl 10 MG TABS Take 10 mg by mouth every 4 (four) hours as needed (pain).    Yes [provider]  promethazine (PHENERGAN) 25 MG tablet Take 1 tablet (25 mg total) by mouth every 6 (six) hours as needed for nausea or vomiting. 11/26/17  Yes Muthersbaugh, Dahlia ClientHannah, PA-C  risperiDONE (RISPERDAL) 2 MG tablet Take 2 mg by mouth 2 (two) times daily. 10/03/17  Yes [provider]     Allergies:   Allergies  Allergen Reactions  . Ondansetron Hives, Itching and Rash       . Metoclopramide Hives, Itching and Rash     Physical Exam:   Vitals:   Vitals:   09/22/18 0951 09/22/18 1120  BP: 109/81 100/60  Pulse: 71 74  Resp: 11 15  Temp:    SpO2: 94% 95%   Physical Exam Constitutional:      Appearance: Normal appearance.  HENT:     Head: Normocephalic and atraumatic.     Nose: Nose normal.     Mouth/Throat:     Mouth: Mucous membranes are moist.  Eyes:     General: Scleral icterus present.     Pupils: Pupils are equal, round, and reactive to light.  Neck:     Musculoskeletal: Normal range of motion.  Cardiovascular:     Rate and Rhythm: Normal rate and regular rhythm.     Pulses: Normal pulses.     Heart sounds: Normal heart sounds.  Pulmonary:     Effort: Pulmonary effort is normal.     Breath sounds: Normal breath sounds.  Abdominal:     General: Abdomen is flat. Bowel  sounds are normal.     Palpations: Abdomen is soft.  Musculoskeletal: Normal range of motion.  Skin:    General: Skin is warm and dry.  Neurological:     General: No focal deficit present.     Mental Status: She is alert. Mental status is at baseline.  Psychiatric:        Mood and Affect: Mood normal. Affect is blunt.        Speech: Speech normal.        Behavior: Behavior normal. Behavior is not agitated or aggressive.        Thought Content: Thought content normal. Thought content is not paranoid or delusional. Thought content does not include homicidal or suicidal ideation. Thought content does not include homicidal or suicidal plan.  Cognition and Memory: Cognition and memory normal.        Judgment: Judgment normal.       Data Review:   CBC Recent Labs  Lab 09/18/18 0302 09/20/18 0558 09/22/18 0215  WBC 21.0* 20.2* 18.0*  HGB 7.5* 8.1* 8.5*  HCT 21.5* 23.1* 24.7*  PLT 520* 521* 422*  MCV 93.5 95.1 95.7  MCH 32.6 33.3 32.9  MCHC 34.9 35.1 34.4  RDW 29.2* 27.8* 26.6*  LYMPHSABS 6.0* 4.7* 4.7*  MONOABS 2.0* 1.6* 1.8*  EOSABS 1.0* 0.5 0.4  BASOSABS 0.0 0.1 0.1   ------------------------------------------------------------------------------------------------------------------  Chemistries  Recent Labs  Lab 09/18/18 0302 09/20/18 0558 09/22/18 0215  NA 136 134* 137  K 4.2 4.0 3.1*  CL 105 104 106  CO2 22 21* 22  GLUCOSE 92 105* 113*  BUN <5* <5* 6  CREATININE 0.59 0.55 0.54  CALCIUM 8.5* 8.8* 8.5*  AST 34 26  --   ALT 13 13  --   ALKPHOS 122 136*  --   BILITOT 2.7* 3.1*  --    ------------------------------------------------------------------------------------------------------------------ estimated creatinine clearance is 81.5 mL/min (by C-G formula based on SCr of 0.54 mg/dL). ------------------------------------------------------------------------------------------------------------------ No results for input(s): TSH, T4TOTAL, T3FREE, THYROIDAB  in the last 72 hours.  Invalid input(s): FREET3  Coagulation profile Recent Labs  Lab 09/22/18 0215  INR 1.1   ------------------------------------------------------------------------------------------------------------------- No results for input(s): DDIMER in the last 72 hours. -------------------------------------------------------------------------------------------------------------------  Cardiac Enzymes No results for input(s): CKMB, TROPONINI, MYOGLOBIN in the last 168 hours.  Invalid input(s): CK ------------------------------------------------------------------------------------------------------------------    Component Value Date/Time   BNP 75.3 09/09/2018 0300    ---------------------------------------------------------------------------------------------------------------  Urinalysis    Component Value Date/Time   COLORURINE YELLOW 11/26/2017 0437   APPEARANCEUR CLEAR 11/26/2017 0437   LABSPEC 1.012 11/26/2017 0437   PHURINE 6.0 11/26/2017 0437   GLUCOSEU NEGATIVE 11/26/2017 0437   HGBUR NEGATIVE 11/26/2017 0437   BILIRUBINUR NEGATIVE 11/26/2017 0437   KETONESUR NEGATIVE 11/26/2017 0437   PROTEINUR 100 (A) 11/26/2017 0437   NITRITE NEGATIVE 11/26/2017 0437   LEUKOCYTESUR NEGATIVE 11/26/2017 0437    ----------------------------------------------------------------------------------------------------------------   Imaging Results:    Ct Angio Chest Pe W/cm &/or Wo Cm  Result Date: 09/22/2018 CLINICAL DATA:  Sickle cell pain.  Shortness of breath.  Asthma. EXAM: CT ANGIOGRAPHY CHEST WITH CONTRAST TECHNIQUE: Multidetector CT imaging of the chest was performed using the standard protocol during bolus administration of intravenous contrast. Multiplanar CT image reconstructions and MIPs were obtained to evaluate the vascular anatomy. CONTRAST:  100mL OMNIPAQUE IOHEXOL 350 MG/ML SOLN COMPARISON:  Two-view chest x-ray 09/20/2018. CTA of the chest 09/08/2018.  FINDINGS: Cardiovascular: Cardiac enlargement is again noted. No significant pericardial effusion is present. Minimal atherosclerotic changes are noted at the arch. Great vessel origins are within normal limits. Descending aorta is unremarkable. Pulmonary arterial opacification is excellent. There is a filling defect in the right upper lobe pulmonary artery suggesting pulmonary embolus. A partial filling defect is present in the posterior left lower lobe segmental branch on image 161 of series 5. Pulmonary artery enlargement is again noted. Bilateral Port-A-Caths are again noted. Mediastinum/Nodes: Mediastinal and bilateral hilar adenopathy is stable. Esophagus is unremarkable. Thoracic inlet is normal. Lungs/Pleura: Mosaic attenuation pattern is similar the prior exam. No significant consolidation is present. No nodule or mass lesion is present. Upper Abdomen: Reflux into the IVC is again noted. Upper abdomen is unremarkable. Musculoskeletal: Mild exaggerated thoracic kyphosis is present. Endplate changes compatible with sickle cell disease are again seen. No acute  abnormalities are present. Review of the MIP images confirms the above findings. IMPRESSION: 1. Filling defects involving the right upper lobar pulmonary artery and a segmental branch of the left lower lobe. Findings suggest pulmonary embolus. 2. Enlarged pulmonary artery compatible with chronic pulmonary arterial hypertension. 3. Stable mosaic attenuation pattern of the lung is likely related to perfusion. 4. 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 Critical Value/emergent results were called by telephone at the time of interpretation on 09/22/2018 at 4:29 am to Dr. Ralph LeydenBRITNI HENDERLY , who verbally acknowledged these results. Electronically Signed   By: Marin Robertshristopher  Mattern M.D.   On: 09/22/2018 04:34      Assessment & Plan:  Active Problems:   Tobacco abuse   Sickle cell crisis (HCC)   Hypoxia   Pulmonary embolism  (HCC)   Chronic pain syndrome   Leukocytosis  Sickle cell disease with pain crisis: Admit, start 0.45% saline at 100 mL/h Weight-based Dilaudid PCA with settings of 0.5 mg, 10-minute lockout, and 3 mg/h IV Toradol 15 mg every 6 hours Continue methadone 5 mg every 12 hours Hold oxycodone, use PCA Dilaudid as substitute 2 L supplemental oxygen, maintain oxygen saturation above 90% Monitor vital signs very closely Reevaluate pain scale regularly Patient will be reevaluated frequently for pain in the context of functioning and relationship to baseline as care progresses.  Pulmonary embolism: CT angiogram shows filling defects involving the right upper lobar pulmonary artery and segmental branch of the left lower lobe.  The findings suggest a pulmonary embolism. Heparin per pharmacy consult Pain control 2 L supplemental oxygen  Leukocytosis: WBCs 18.0, no signs of infection or inflammation.  Suspected to be reactive.  Continue to monitor closely. Follow CBC  Sickle cell anemia Hemoglobin 8.5, consistent with patient's baseline.  No clinical indication for blood transfusion at this time Continue folic acid 1 mg daily Follow CBC, repeat in a.m.   Chronic pain syndrome: We will continue methadone 5 mg every 12 hours without interruption Hold oxycodone, use PCA Dilaudid as substitute  History of schizophrenia: Stable.  Continue Risperdal and duloxetine.  Patient denies any visual or auditory hallucinations.  She also denies suicidal or homicidal ideations.  Hypokalemia: Potassium 3.1, replete with K- Dur 20 mEq daily Repeat BMP in a.m.   DVT Prophylaxis: Subcut Lovenox   AM Labs Ordered, also please review Full Orders  Family Communication: Admission, patient's condition and plan of care including tests being ordered have been discussed with the patient who indicate understanding and agree with the plan and Code Status.  Code Status: Full Code  Consults called: None     Admission status: Inpatient    Time spent in minutes : 50 minutes  Nolon NationsLachina Moore Leyah Bocchino  APRN, MSN, FNP-C Patient Care [facility] [address]  [employer] [place], [place] [id number] [phone]  09/22/2018 at 11:48 AM

## 2018-09-22 NOTE — Progress Notes (Signed)
[facility] for Heparin Indication: pulmonary embolus  Allergies  Allergen Reactions  . Ondansetron Hives, Itching and Rash       . Metoclopramide Hives, Itching and Rash    Patient Measurements:   Heparin Dosing Weight: 58.2  Vital Signs: Temp: 98.6 F (37 C) (06/10 0111) BP: 111/74 (06/10 0335) Pulse Rate: 81 (06/10 0335)  Labs: Recent Labs    09/20/18 0558 09/22/18 0215  HGB 8.1* 8.5*  HCT 23.1* 24.7*  PLT 521* 422*  CREATININE 0.55 0.54   Estimated Creatinine Clearance: 81.5 mL/min (by C-G formula based on SCr of 0.54 mg/dL).  Medical History: Past Medical History:  Diagnosis Date  . Asthma   . Sickle cell anemia (HCC)    Medications:  Scheduled:  . sodium chloride (PF)       Infusions:    Assessment: 37 yoF, Sickle cell dz, admit with ShOB, PE on CT. Prev admit 5/27, ED visits 6/6 & 6/8.   Begin Heparin gtt for  bilateral PE Probable chronic PAH, and RH dysfx per CT  Admit Hgb 8.5, Plt 422  Goal of Therapy:  Heparin level 0.3-0.7 units/ml Monitor platelets by anticoagulation protocol: Yes   Plan:   Heparin bolus 3000 units, infusion at 1000 units/hr  1st hep level at 12N  Daily CBC, order daily Hep level at steady state  Minda Ditto PharmD Pager [phone] 09/22/2018, 5:09 AM

## 2018-09-22 NOTE — ED Notes (Signed)
Pt requesting more benadryl. PA made aware

## 2018-09-22 NOTE — Progress Notes (Signed)
ANTICOAGULATION CONSULT NOTE - Follow Up Consult  Pharmacy Consult for heparin Indication: acute pulmonary embolus  Allergies  Allergen Reactions  . Ondansetron Hives, Itching and Rash       . Metoclopramide Hives, Itching and Rash    Patient Measurements: weight 68 kg, height 59 inches   Heparin Dosing Weight: 68 kg  Vital Signs: Temp: 98.6 F (37 C) (06/10 0111) BP: 109/81 (06/10 0951) Pulse Rate: 71 (06/10 0951)  Labs: Recent Labs    09/20/18 0558 09/22/18 0215  HGB 8.1* 8.5*  HCT 23.1* 24.7*  PLT 521* 422*  APTT  --  30  LABPROT  --  14.1  INR  --  1.1  CREATININE 0.55 0.54    Estimated Creatinine Clearance: 81.5 mL/min (by C-G formula based on SCr of 0.54 mg/dL).   Assessment: Patient's a 38 y.o F with hx sickle cell anemia presented to the ED on 6/10 for sickle cell pain crisis. Chest CTA on 6/10 showed findings consistent with acute PE.  Heparin started on admission for PE.  Today, 09/22/2018: - first heparin level is therapeutic at 0.30 - no bleeding documented  Goal of Therapy:  Heparin level 0.3-0.7 units/ml Monitor platelets by anticoagulation protocol: Yes   Plan:  - continue heparin drip at 1000 units/hr - re-check 6 hr heparin level to confirm it's still therapeutic before changing to daily monitoring - monitor for s/s bleeding  Tavian Callander P 09/22/2018,11:20 AM

## 2018-09-22 NOTE — Progress Notes (Signed)
ANTICOAGULATION CONSULT NOTE - Follow Up Consult  Pharmacy Consult for heparin Indication: acute pulmonary embolus  Allergies  Allergen Reactions  . Ondansetron Hives, Itching and Rash       . Metoclopramide Hives, Itching and Rash    Patient Measurements: weight 68 kg, height 59 inches   Heparin Dosing Weight: 68 kg  Vital Signs: Temp: 97.7 F (36.5 C) (06/10 1718) Temp Source: Oral (06/10 1718) BP: 102/64 (06/10 1718) Pulse Rate: 72 (06/10 1718)  Labs: Recent Labs    09/20/18 0558 09/22/18 0215 09/22/18 1401 09/22/18 2004  HGB 8.1* 8.5*  --   --   HCT 23.1* 24.7*  --   --   PLT 521* 422*  --   --   APTT  --  30  --   --   LABPROT  --  14.1  --   --   INR  --  1.1  --   --   HEPARINUNFRC  --   --  0.30 0.38  CREATININE 0.55 0.54  --   --     Estimated Creatinine Clearance: 81.5 mL/min (by C-G formula based on SCr of 0.54 mg/dL).   Assessment: Patient's a 38 y.o F with hx sickle cell anemia presented to the ED on 6/10 for sickle cell pain crisis. Chest CTA on 6/10 showed findings consistent with acute PE.  Heparin started on admission for PE.  Today, 09/22/2018: - Confirmatory heparin level is therapeutic on 1000 units/hr - no bleeding or infusion issues documented - hgb low but stable, Plt WNL/high  Goal of Therapy:  Heparin level 0.3-0.7 units/ml Monitor platelets by anticoagulation protocol: Yes   Plan:  - continue heparin drip at 1000 units/hr - Daily CBC and heparin level - monitor for s/s bleeding  Ambrea Hegler A 09/22/2018,8:35 PM

## 2018-09-22 NOTE — ED Notes (Signed)
ED TO INPATIENT HANDOFF REPORT  Name/Age/Gender Joanna [name] 38 y.o. female  Code Status Code Status History    Date Active Date Inactive Code Status Order ID Comments User Context   09/08/2018 2342 09/09/2018 1933 Full Code [id number]  Eduard ClosKakrakandy, Arshad N, MD ED   11/20/2017 1500 11/23/2017 1456 Full Code [id number]  Eulah PontBlum, Nina, MD ED   10/19/2017 1108 10/21/2017 1351 Full Code [id number]  Eulah PontBlum, Nina, MD ED   09/28/2017 1050 10/01/2017 1319 Full Code [id number]  Molt, Toma CopierBethany, DO ED   09/21/2017 1435 09/24/2017 2123 Full Code [id number]  Burna CashSantos-Sanchez, Idalys, MD ED      Home/SNF/Other Home  Chief Complaint Sickle Cell Pain Crisis  Level of Care/Admitting Diagnosis ED Disposition    ED Disposition Condition Comment   Admit  [facility] [[id number][facility]  Level of Care: Telemetry [5]  Admit to tele based on following criteria: Monitor for Ischemic changes  Covid Evaluation: Screening Protocol (No Symptoms)  Diagnosis: Pulmonary embolism (HCC) [241700]  Admitting Physician: Eduard ClosKAKRAKANDY, ARSHAD N [phone][[id number]]  Attending Physician: Eduard ClosKAKRAKANDY, ARSHAD N [phone][[id number]]  Estimated length of stay: past midnight tomorrow  Certification:: I certify this patient will need inpatient services for at least 2 midnights  PT Class (Do Not Modify): Inpatient [101]  PT Acc Code (Do Not Modify): Private [1]       Medical History Past Medical History:  Diagnosis Date  . Asthma   . Sickle cell anemia (HCC)     Allergies Allergies  Allergen Reactions  . Ondansetron Hives, Itching and Rash       . Metoclopramide Hives, Itching and Rash    IV Location/Drains/Wounds Patient Lines/Drains/Airways Status   Active Line/Drains/Airways    Name:   Placement date:   Placement time:   Site:   Days:   Implanted Port 08/27/18 Left Chest   08/27/18    0524    Chest   26          Labs/Imaging Results for orders placed or performed during the hospital encounter of 09/22/18 (from the past 48  hour(s))  CBC with Differential/Platelet     Status: Abnormal   Collection Time: 09/22/18  2:15 AM  Result Value Ref Range   WBC 18.0 (H) 4.0 - 10.5 K/uL    Comment: WHITE COUNT CONFIRMED ON SMEAR   RBC 2.58 (L) 3.87 - 5.11 MIL/uL   Hemoglobin 8.5 (L) 12.0 - 15.0 g/dL   HCT 25.324.7 (L) 66.436.0 - 40.346.0 %   MCV 95.7 80.0 - 100.0 fL   MCH 32.9 26.0 - 34.0 pg   MCHC 34.4 30.0 - 36.0 g/dL   RDW 47.426.6 (H) 25.911.5 - 56.315.5 %   Platelets 422 (H) 150 - 400 K/uL   nRBC 6.4 (H) 0.0 - 0.2 %   Neutrophils Relative % 61 %   Neutro Abs 10.9 (H) 1.7 - 7.7 K/uL   Lymphocytes Relative 26 %   Lymphs Abs 4.7 (H) 0.7 - 4.0 K/uL   Monocytes Relative 10 %   Monocytes Absolute 1.8 (H) 0.1 - 1.0 K/uL   Eosinophils Relative 2 %   Eosinophils Absolute 0.4 0.0 - 0.5 K/uL   Basophils Relative 0 %   Basophils Absolute 0.1 0.0 - 0.1 K/uL   Immature Granulocytes 1 %   Abs Immature Granulocytes 0.16 (H) 0.00 - 0.07 K/uL   Carollee MassedHowell Jolly Bodies PRESENT    Polychromasia MARKED    Sickle Cells MARKED    Target Cells PRESENT  Comment: Performed at [facility], [address] [place], [place] [address]  Basic metabolic panel     Status: Abnormal   Collection Time: 09/22/18  2:15 AM  Result Value Ref Range   Sodium 137 135 - 145 mmol/L   Potassium 3.1 (L) 3.5 - 5.1 mmol/L   Chloride 106 98 - 111 mmol/L   CO2 22 22 - 32 mmol/L   Glucose, Bld 113 (H) 70 - 99 mg/dL   BUN 6 6 - 20 mg/dL   Creatinine, Ser 4.030.54 0.44 - 1.00 mg/dL   Calcium 8.5 (L) 8.9 - 10.3 mg/dL   GFR calc non Af Amer >60 >60 mL/min   GFR calc Af Amer >60 >60 mL/min   Anion gap 9 5 - 15    Comment: Performed at [facility], [address] [place], [place] [address]  Reticulocytes     Status: Abnormal   Collection Time: 09/22/18  2:15 AM  Result Value Ref Range   Retic Ct Pct >30.0 (H) 0.4 - 3.1 %    Comment: RESULTS CONFIRMED BY MANUAL DILUTION   RBC. 2.58 (L) 3.87 - 5.11 MIL/uL   Retic Count, Absolute Not  Measured 19.0 - 186.0 K/uL   Immature Retic Fract 7.7 2.3 - 15.9 %    Comment: Performed at [facility], [address] [place], [place] [address]  SARS Coronavirus 2 (CEPHEID - Performed in [facility] hospital lab), Hosp Order     Status: None   Collection Time: 09/22/18  2:15 AM  Result Value Ref Range   SARS Coronavirus 2 NEGATIVE NEGATIVE    Comment: (NOTE) If result is NEGATIVE SARS-CoV-2 target nucleic acids are NOT DETECTED. The SARS-CoV-2 RNA is generally detectable in upper and lower  respiratory specimens during the acute phase of infection. The lowest  concentration of SARS-CoV-2 viral copies this assay can detect is 250  copies / mL. A negative result does not preclude SARS-CoV-2 infection  and should not be used as the sole basis for treatment or other  patient management decisions.  A negative result may occur with  improper specimen collection / handling, submission of specimen other  than nasopharyngeal swab, presence of viral mutation(s) within the  areas targeted by this assay, and inadequate number of viral copies  (<250 copies / mL). A negative result must be combined with clinical  observations, patient history, and epidemiological information. If result is POSITIVE SARS-CoV-2 target nucleic acids are DETECTED. The SARS-CoV-2 RNA is generally detectable in upper and lower  respiratory specimens dur ing the acute phase of infection.  Positive  results are indicative of active infection with SARS-CoV-2.  Clinical  correlation with patient history and other diagnostic information is  necessary to determine patient infection status.  Positive results do  not rule out bacterial infection or co-infection with other viruses. If result is PRESUMPTIVE POSTIVE SARS-CoV-2 nucleic acids MAY BE PRESENT.   A presumptive positive result was obtained on the submitted specimen  and confirmed on repeat testing.  While 2019 novel coronavirus  (SARS-CoV-2)  nucleic acids may be present in the submitted sample  additional confirmatory testing may be necessary for epidemiological  and / or clinical management purposes  to differentiate between  SARS-CoV-2 and other Sarbecovirus currently known to infect humans.  If clinically indicated additional testing with an alternate test  methodology [phone]([id number]) is advised. The SARS-CoV-2 RNA is generally  detectable in upper and lower respiratory sp ecimens during the acute  phase of infection. The expected result is Negative. Fact Sheet for Patients:  BoilerBrush.com.cyhttps://www.fda.gov/media/136312/download Fact Sheet for Healthcare Providers: https://pope.com/https://www.fda.gov/media/136313/download This test is not yet approved or cleared by the [place] FDA and has been authorized for detection and/or diagnosis of SARS-CoV-2 by FDA under an Emergency Use Authorization (EUA).  This EUA will remain in effect (meaning this test can be used) for the duration of the COVID-19 declaration under Section 564(b)(1) of the Act, 21 U.S.C. section 360bbb-3(b)(1), unless the authorization is terminated or revoked sooner. Performed at [facility], [address] [place], [place] [address]   Protime-INR     Status: None   Collection Time: 09/22/18  2:15 AM  Result Value Ref Range   Prothrombin Time 14.1 11.4 - 15.2 seconds   INR 1.1 0.8 - 1.2    Comment: (NOTE) INR goal varies based on device and disease states. Performed at [facility], [address] [place], [place] [address]   APTT     Status: None   Collection Time: 09/22/18  2:15 AM  Result Value Ref Range   aPTT 30 24 - 36 seconds    Comment: Performed at [facility], [address] [place], [place] [address]  I-Stat Beta hCG blood, ED ([facility], [facility], [facility] only)     Status: None   Collection Time: 09/22/18  2:23 AM  Result Value Ref Range   I-stat hCG, quantitative <5.0 <5 mIU/mL   Comment 3            Comment:    GEST. AGE      CONC.  (mIU/mL)   <=1 WEEK        5 - 50     2 WEEKS       50 - 500     3 WEEKS       100 - 10,000     4 WEEKS     1,000 - 30,000        FEMALE AND NON-PREGNANT FEMALE:     LESS THAN 5 mIU/mL    Dg Chest 2 View  Result Date: 09/20/2018 CLINICAL DATA:  Chest pain.  Sickle cell disease. EXAM: CHEST - 2 VIEW COMPARISON:  Chest x-ray and chest CT 09/08/2018 FINDINGS: Stable cardiac enlargement, vascular congestion and probable pulmonary edema. No pleural effusions or focal infiltrates. Right and left Port-A-Cath are noted. No change. The bony thorax is intact. IMPRESSION: Cardiac enlargement, vascular congestion and mild interstitial edema. No definite infiltrates or effusions. Electronically Signed   By: Rudie MeyerP.  Gallerani M.D.   On: 09/20/2018 11:03   Ct Angio Chest Pe W/cm &/or Wo Cm  Result Date: 09/22/2018 CLINICAL DATA:  Sickle cell pain.  Shortness of breath.  Asthma. EXAM: CT ANGIOGRAPHY CHEST WITH CONTRAST TECHNIQUE: Multidetector CT imaging of the chest was performed using the standard protocol during bolus administration of intravenous contrast. Multiplanar CT image reconstructions and MIPs were obtained to evaluate the vascular anatomy. CONTRAST:  100mL OMNIPAQUE IOHEXOL 350 MG/ML SOLN COMPARISON:  Two-view chest x-ray 09/20/2018. CTA of the chest 09/08/2018. FINDINGS: Cardiovascular: Cardiac enlargement is again noted. No significant pericardial effusion is present. Minimal atherosclerotic changes are noted at the arch. Great vessel origins are within normal limits. Descending aorta is unremarkable. Pulmonary arterial opacification is excellent. There is a filling defect in the right upper lobe pulmonary artery suggesting pulmonary embolus. A partial filling defect is present in the posterior left lower lobe segmental branch on image 161 of series  5. Pulmonary artery enlargement is again noted. Bilateral Port-A-Caths are again noted. Mediastinum/Nodes: Mediastinal and bilateral hilar  adenopathy is stable. Esophagus is unremarkable. Thoracic inlet is normal. Lungs/Pleura: Mosaic attenuation pattern is similar the prior exam. No significant consolidation is present. No nodule or mass lesion is present. Upper Abdomen: Reflux into the IVC is again noted. Upper abdomen is unremarkable. Musculoskeletal: Mild exaggerated thoracic kyphosis is present. Endplate changes compatible with sickle cell disease are again seen. No acute abnormalities are present. Review of the MIP images confirms the above findings. IMPRESSION: 1. Filling defects involving the right upper lobar pulmonary artery and a segmental branch of the left lower lobe. Findings suggest pulmonary embolus. 2. Enlarged pulmonary artery compatible with chronic pulmonary arterial hypertension. 3. Stable mosaic attenuation pattern of the lung is likely related to perfusion. 4. 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 Critical Value/emergent results were called by telephone at the time of interpretation on 09/22/2018 at 4:29 am to Dr. Shelby Dubin , who verbally acknowledged these results. Electronically Signed   By: San Morelle M.D.   On: 09/22/2018 04:34    Pending Labs Unresulted Labs (From admission, onward)    Start     Ordered   09/23/18 0500  CBC  Daily,   R     09/22/18 0510   09/22/18 1200  Heparin level (unfractionated)  Once-Timed,   R     09/22/18 0510          Vitals/Pain Today's Vitals   09/22/18 0200 09/22/18 0228 09/22/18 0335 09/22/18 0340  BP: 105/78  111/74   Pulse: 88  81   Resp: 11  13   Temp:      SpO2: 98%  95%   PainSc:  8   6     Isolation Precautions No active isolations  Medications Medications  sodium chloride (PF) 0.9 % injection (has no administration in time range)  heparin ADULT infusion 100 units/mL (25000 units/297mL sodium chloride 0.45%) (1,000 Units/hr Intravenous New Bag/Given 09/22/18 0528)  HYDROmorphone (DILAUDID) injection 2 mg (2  mg Intravenous Given 09/22/18 0228)    Or  HYDROmorphone (DILAUDID) injection 2 mg ( Subcutaneous See Alternative 09/22/18 0228)  HYDROmorphone (DILAUDID) injection 2 mg (2 mg Intravenous Given 09/22/18 0333)    Or  HYDROmorphone (DILAUDID) injection 2 mg ( Subcutaneous See Alternative 09/22/18 0333)  HYDROmorphone (DILAUDID) injection 2 mg (2 mg Intravenous Given 09/22/18 0259)    Or  HYDROmorphone (DILAUDID) injection 2 mg ( Subcutaneous See Alternative 09/22/18 0259)  diphenhydrAMINE (BENADRYL) injection 25 mg (25 mg Intravenous Given 09/22/18 0226)  diphenhydrAMINE (BENADRYL) injection 25 mg (25 mg Intravenous Given 09/22/18 0330)  iohexol (OMNIPAQUE) 350 MG/ML injection 100 mL (100 mLs Intravenous Contrast Given 09/22/18 0403)  heparin bolus via infusion 3,000 Units (3,000 Units Intravenous Bolus from Bag 09/22/18 0530)    Mobility walks

## 2018-09-22 NOTE — ED Notes (Signed)
Pt given food and a drink. No reports of emesis or difficulty swallowing.

## 2018-09-22 NOTE — ED Notes (Signed)
Pt back from radiology 

## 2018-09-22 NOTE — ED Provider Notes (Signed)
[facility]   CSN: [id number] Arrival date & time: 09/22/18  0102  History   Chief Complaint Chief Complaint  Patient presents with  . Sickle Cell Pain Crisis   HPI Hillary Bowngel Keough is a 38 y.o. female with past medical history significant for sickle cell anemia who presents for evaluation of sickle cell pain.  Patient states she has pain gated to bilateral arms and bilateral legs.  Was seen at [facility] emergency department 2 days ago and was to be admitted for hypoxia and [facility] however patient left AMA.  Patient states she has increased pain.  She rates her pain 9/10.  Denies chest pain or shortness of breath.  Patient has prior history of acute chest syndrome or PE.  Denies fever, chills, nausea, vomiting, abdominal pain, diarrhea, dysuria, decreased range of motion, numbness or tingling to her extremities, redness, warmth, swelling, decreased with ambulation, rashes or lesions.  She has been taking her home oxycodone and methadone without relief of her pain.  History obtained from patient.  No interpreter was used.  Prior medical records reviewed.     HPI  Past Medical History:  Diagnosis Date  . Asthma   . Sickle cell anemia [facility](HCC)     Patient Active Problem List   Diagnosis Date Noted  . Pulmonary embolism (HCC) 09/22/2018  . Hypoxia   . Community acquired pneumonia   . Sickle cell crisis (HCC) 09/28/2017  . Sickle cell anemia with pain (HCC) 09/28/2017  . Sickle cell pain crisis (HCC) 09/21/2017  . Abdominal pain 09/21/2017  . Tobacco abuse 09/21/2017    History reviewed. No pertinent surgical history.   OB History    Gravida  1   Para      Term      Preterm      AB      Living        SAB      TAB      Ectopic      Multiple      Live Births               Home Medications    Prior to Admission medications   Medication Sig Start Date End Date Taking? Authorizing  Provider  albuterol (PROVENTIL HFA) 108 (90 Base) MCG/ACT inhaler Inhale 2 puffs into the lungs every 6 (six) hours as needed for wheezing.  12/20/13  Yes [provider]  DULoxetine (CYMBALTA) 30 MG capsule Take 30-60 mg by mouth See admin instructions. Take 2 capsules (60mg ) in the morning and 1 capsule (30mg ) in the evening. 09/04/17  Yes [provider]  fluticasone (FLONASE) 50 MCG/ACT nasal spray Place 1 spray into the nose daily as needed for allergies.    Yes [provider]  folic acid (FOLVITE) 1 MG tablet Take 1 mg by mouth daily.   Yes [provider]  gabapentin (NEURONTIN) 100 MG capsule Take 100 mg by mouth 3 (three) times daily. 08/17/17  Yes [provider]  hydroxyurea (HYDREA) 100 mg/mL SUSP Take 1,000 mg by mouth daily.    Yes [provider]  methadone (DOLOPHINE) 5 MG tablet Take 5 mg by mouth every 12 (twelve) hours.   Yes [provider]  naloxone (NARCAN) nasal spray 4 mg/0.1 mL Place 1 spray into the nose once as needed (opiod overdose).  11/21/14  Yes [provider]  Oxycodone HCl 10 MG TABS Take 10 mg by mouth  every 4 (four) hours as needed (pain).    Yes [provider]  promethazine (PHENERGAN) 25 MG tablet Take 1 tablet (25 mg total) by mouth every 6 (six) hours as needed for nausea or vomiting. 11/26/17  Yes Muthersbaugh, Dahlia Client, PA-C  risperiDONE (RISPERDAL) 2 MG tablet Take 2 mg by mouth 2 (two) times daily. 10/03/17  Yes [provider]    Family History Family History  Problem Relation Age of Onset  . Diabetes Father     Social History Social History   Tobacco Use  . Smoking status: Current Every Day Smoker    Packs/day: 1.00  . Smokeless tobacco: Current User  Substance Use Topics  . Alcohol use: Not Currently    Comment: "every blue moon"  . Drug use: Not Currently     Allergies   Ondansetron and Metoclopramide   Review of Systems Review of Systems   Constitutional: Negative.   HENT: Negative.   Respiratory: Negative.   Cardiovascular: Negative.   Gastrointestinal: Negative.   Genitourinary: Negative.   Musculoskeletal: Negative.   Skin: Negative.   Neurological: Negative.   All other systems reviewed and are negative.  Physical Exam Updated Vital Signs BP 111/74   Pulse 81   Temp 98.6 F (37 C)   Resp 13   LMP 09/22/2018 Comment: negative beta HCG 09/22/18  SpO2 95%   Physical Exam Vitals signs and nursing note reviewed.  Constitutional:      General: She is not in acute distress.    Appearance: She is well-developed. She is not ill-appearing, toxic-appearing or diaphoretic.  HENT:     Head: Normocephalic and atraumatic.     Nose: Nose normal.     Mouth/Throat:     Mouth: Mucous membranes are moist.     Pharynx: Oropharynx is clear.  Eyes:     Pupils: Pupils are equal, round, and reactive to light.  Neck:     Musculoskeletal: Normal range of motion.  Cardiovascular:     Rate and Rhythm: Normal rate.     Pulses: Normal pulses.          Dorsalis pedis pulses are 2+ on the right side and 2+ on the left side.       Posterior tibial pulses are 2+ on the right side and 2+ on the left side.     Heart sounds: Normal heart sounds. No murmur. No friction rub. No gallop.   Pulmonary:     Effort: Pulmonary effort is normal. No respiratory distress.     Breath sounds: Normal breath sounds and air entry.  Abdominal:     General: Bowel sounds are normal. There is no distension.     Palpations: Abdomen is soft.     Tenderness: There is no right CVA tenderness, left CVA tenderness, guarding or rebound.  Musculoskeletal: Normal range of motion.        General: No swelling, tenderness, deformity or signs of injury.     Right knee: Normal.     Left knee: Normal.     Right ankle: Normal.     Left ankle: Normal.     Right upper arm: Normal.     Left upper arm: Normal.     Right forearm: Normal.     Left forearm: Normal.      Right lower leg: No edema.     Left lower leg: No edema.     Comments: No lower extremity edema, erythema, ecchymosis, warmth. No tender bilateral calves. Negative Homans.  Full range of motion bilateral upper and lower extremities without difficulty.  Compartments soft.  No contusions or abrasions.  No obvious deformities.  No bony tenderness.  No joint swelling.  Skin:    General: Skin is warm and dry.     Capillary Refill: Capillary refill takes less than 2 seconds.     Comments: No rashes or lesions. Brisk cap refill.  Neurological:     Mental Status: She is alert.     Comments: CN grossly 2-12 intact. No facial droop. Ambulatory in ED without difficulty.5/5 strength bilateral extremities without difficulty.  Ambulatory in ED without difficulty.    ED Treatments / Results  Labs (all labs ordered are listed, but only abnormal results are displayed) Labs Reviewed  CBC WITH DIFFERENTIAL/PLATELET - Abnormal; Notable for the following components:      Result Value   WBC 18.0 (*)    RBC 2.58 (*)    Hemoglobin 8.5 (*)    HCT 24.7 (*)    RDW 26.6 (*)    Platelets 422 (*)    nRBC 6.4 (*)    Neutro Abs 10.9 (*)    Lymphs Abs 4.7 (*)    Monocytes Absolute 1.8 (*)    Abs Immature Granulocytes 0.16 (*)    All other components within normal limits  BASIC METABOLIC PANEL - Abnormal; Notable for the following components:   Potassium 3.1 (*)    Glucose, Bld 113 (*)    Calcium 8.5 (*)    All other components within normal limits  RETICULOCYTES - Abnormal; Notable for the following components:   Retic Ct Pct >30.0 (*)    RBC. 2.58 (*)    All other components within normal limits  SARS CORONAVIRUS 2 (HOSPITAL ORDER, PERFORMED IN [facility])  HEPARIN LEVEL (UNFRACTIONATED)  PROTIME-INR  APTT  I-STAT BETA HCG BLOOD, ED ([facility], [facility], [facility] ONLY)    EKG EKG Interpretation  Date/Time:  Wednesday September 22 2018 02:26:10 EDT Ventricular Rate:  84 PR Interval:    QRS Duration: 97 QT  Interval:  423 QTC Calculation: 501 R Axis:   31 Text Interpretation:  Sinus rhythm Ventricular premature complex RSR' in V1 or V2, probably normal variant Borderline T abnormalities, anterior leads Borderline prolonged QT interval Confirmed by Palumbo, April ([id number]) on 09/22/2018 4:13:49 AM   Radiology Dg Chest 2 View  Result Date: 09/20/2018 CLINICAL DATA:  Chest pain.  Sickle cell disease. EXAM: CHEST - 2 VIEW COMPARISON:  Chest x-ray and chest CT 09/08/2018 FINDINGS: Stable cardiac enlargement, vascular congestion and probable pulmonary edema. No pleural effusions or focal infiltrates. Right and left Port-A-Cath are noted. No change. The bony thorax is intact. IMPRESSION: Cardiac enlargement, vascular congestion and mild interstitial edema. No definite infiltrates or effusions. Electronically Signed   By: Rudie MeyerP.  Gallerani M.D.   On: 09/20/2018 11:03   Ct Angio Chest Pe W/cm &/or Wo Cm  Result Date: 09/22/2018 CLINICAL DATA:  Sickle cell pain.  Shortness of breath.  Asthma. EXAM: CT ANGIOGRAPHY CHEST WITH CONTRAST TECHNIQUE: Multidetector CT imaging of the chest was performed using the standard protocol during bolus administration of intravenous contrast. Multiplanar CT image reconstructions and MIPs were obtained to evaluate the vascular anatomy. CONTRAST:  100mL OMNIPAQUE IOHEXOL 350 MG/ML SOLN COMPARISON:  Two-view chest x-ray 09/20/2018. CTA of the chest 09/08/2018. FINDINGS: Cardiovascular: Cardiac enlargement is again noted. No significant pericardial effusion is present. Minimal atherosclerotic changes are noted at the arch. Great vessel origins are within normal limits. Descending  aorta is unremarkable. Pulmonary arterial opacification is excellent. There is a filling defect in the right upper lobe pulmonary artery suggesting pulmonary embolus. A partial filling defect is present in the posterior left lower lobe segmental branch on image 161 of series 5. Pulmonary artery enlargement is again  noted. Bilateral Port-A-Caths are again noted. Mediastinum/Nodes: Mediastinal and bilateral hilar adenopathy is stable. Esophagus is unremarkable. Thoracic inlet is normal. Lungs/Pleura: Mosaic attenuation pattern is similar the prior exam. No significant consolidation is present. No nodule or mass lesion is present. Upper Abdomen: Reflux into the IVC is again noted. Upper abdomen is unremarkable. Musculoskeletal: Mild exaggerated thoracic kyphosis is present. Endplate changes compatible with sickle cell disease are again seen. No acute abnormalities are present. Review of the MIP images confirms the above findings. IMPRESSION: 1. Filling defects involving the right upper lobar pulmonary artery and a segmental branch of the left lower lobe. Findings suggest pulmonary embolus. 2. Enlarged pulmonary artery compatible with chronic pulmonary arterial hypertension. 3. Stable mosaic attenuation pattern of the lung is likely related to perfusion. 4. 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 Critical Value/emergent results were called by telephone at the time of interpretation on 09/22/2018 at 4:29 am to Dr. Ralph LeydenBRITNI Quentin Shorey , who verbally acknowledged these results. Electronically Signed   By: Marin Robertshristopher  Mattern M.D.   On: 09/22/2018 04:34    Procedures .Critical Care Performed by: Linwood DibblesHenderly, Deasiah Hagberg A, PA-C Authorized by: Linwood DibblesHenderly, Elzy Tomasello A, PA-C   Critical care provider statement:    Critical care time (minutes):  45   Critical care was necessary to treat or prevent imminent or life-threatening deterioration of the following conditions:  Circulatory failure and respiratory failure   Critical care was time spent personally by me on the following activities:  Discussions with consultants, evaluation of patient's response to treatment, examination of patient, ordering and performing treatments and interventions, ordering and review of laboratory studies, ordering and review of  radiographic studies, pulse oximetry, re-evaluation of patient's condition, obtaining history from patient or surrogate and review of old charts   (including critical care time)  Medications Ordered in ED Medications  sodium chloride (PF) 0.9 % injection (has no administration in time range)  heparin bolus via infusion 3,000 Units (has no administration in time range)  heparin ADULT infusion 100 units/mL (25000 units/25550mL sodium chloride 0.45%) (has no administration in time range)  HYDROmorphone (DILAUDID) injection 2 mg (2 mg Intravenous Given 09/22/18 0228)    Or  HYDROmorphone (DILAUDID) injection 2 mg ( Subcutaneous See Alternative 09/22/18 0228)  HYDROmorphone (DILAUDID) injection 2 mg (2 mg Intravenous Given 09/22/18 0333)    Or  HYDROmorphone (DILAUDID) injection 2 mg ( Subcutaneous See Alternative 09/22/18 0333)  HYDROmorphone (DILAUDID) injection 2 mg (2 mg Intravenous Given 09/22/18 0259)    Or  HYDROmorphone (DILAUDID) injection 2 mg ( Subcutaneous See Alternative 09/22/18 0259)  diphenhydrAMINE (BENADRYL) injection 25 mg (25 mg Intravenous Given 09/22/18 0226)  diphenhydrAMINE (BENADRYL) injection 25 mg (25 mg Intravenous Given 09/22/18 0330)  iohexol (OMNIPAQUE) 350 MG/ML injection 100 mL (100 mLs Intravenous Contrast Given 09/22/18 0403)   Initial Impression / Assessment and Plan / ED Course  I have reviewed the triage vital signs and the nursing notes.  Pertinent labs & imaging results that were available during my care of the patient were reviewed by me and considered in my medical decision making (see chart for details).  Prior medical records reviewed.  38 year old female appears otherwise well presents for evaluation of  sickle cell pain.  States has pain to bilateral upper and lower extremities which is consistent with her typical sickle cell crises.  She denies chest pain or shortness of breath.  She is afebrile, nonseptic, non-ill-appearing.  Has been using her home  medications without relief of her pain.  I had actually seen the patient in [facility] emergency department 2 days ago for similar symptoms.  She was noted to be hypoxic at that time.  She was pending work-up for hypoxia when her care transferred to another provider.  At that time patient signed out [place] after recommendation for admission.  Patient denies any chest pain or shortness of breath here during her visit today however was noted to be hypoxic with oxygen saturations in the 70s on arrival.  She denies any known COVID positive exposures. She denies cough, congestion, rhinorrhea.  Lower extremities without tenderness, erythema, warmth.  Nontender bilateral calves.  Compartments are soft.  Normal musculoskeletal exam.  Neurovascularly intact.  No pain to joints without overlying skin changes.  I have low suspicion for septic arthritis, septic joint, gout, hemarthrosis, ANV.  Concern for acute chest syndrome versus PE given hypoxia with SS disease.  CT PE shows filling defects involving the right upper lobar pulmonary artery and a segmental branch of the left lower lobe. Findings suggest pulmonary embolus. 2. Enlarged pulmonary artery compatible with chronic pulmonary arterial hypertension. VS stable. On 2L nasal cannula. On reassessment patient requesting additional pain medication. I had to shake the patient to wake her for reassessment. Do not think she needs additional pain medication at this time. Will hold on additional pain medication at this time. Patient still requiring 3L nasal cannula at this time however without tachycardia or tachypnea. Continues to deny CP or SOB.  Labs and imaging personally reviewed. EKG without ST/T changes however with Long QT, will hold on prologing QT medications at this time.  CBC with leukocytosis at 18.0, at patient's baseline, hemoglobin at baseline.  hCG negative.  Metabolic panel at patient's baseline. COVID negative  Will consult with  Hospitalist for admission for PE. Heparin to be dosed from pharmacy. No history of hemorrhages or brain bleeds.  0515: Consulted with Dr. Hal Hope, [facility] who will evaluate patient for admission.  Patient remains hemodynamically stable.  No respiratory distress on 3 L oxygen via nasal cannula.     Final Clinical Impressions(s) / ED Diagnoses   Final diagnoses:  Sickle cell pain crisis [facility])  Multiple subsegmental pulmonary emboli without acute cor pulmonale    ED Discharge Orders    None       Adriana Quinby A, PA-C 09/22/18 0527    Palumbo, April, MD 09/22/18 [id number]

## 2018-09-22 NOTE — ED Triage Notes (Signed)
Pt c/o sickle cell pain in arms and legs, sts was at [facility] ER few days ago and was to be admitted and transferred to [facility] hospital, but she was worried about the price and refused. Pt on arrival has O2 level at 77% on RA, placed on O2 3L and O2 is 91-92%. Pt sts she always feels short of breath, and when she was d/cd from [facility] the other day she was told her O2 was very low.

## 2018-09-22 NOTE — ED Notes (Signed)
Patient transported to CT 

## 2018-09-22 NOTE — ED Notes (Signed)
ED TO INPATIENT HANDOFF REPORT  ED Nurse Name and Phone #:   S Name/Age/Gender Joanna [name] 38 y.o. female Room/Bed: WA19/WA19  Code Status   Code Status: Prior  Home/SNF/Other Home Patient oriented to: self, place, time and situation Is this baseline? Yes   Triage Complete: Triage complete  Chief Complaint Sickle Cell Pain Crisis  Triage Note Pt c/o sickle cell pain in arms and legs, sts [facility] ago and [facility] to be [facility], but she was worried about the price and refused. Pt on arrival has O2 level at 77% on RA, placed on O2 3L and O2 is 91-92%. Pt sts she always feels short of breath, and when she was [facility] other [facility] she was told her O2 was very low.    Allergies Allergies  Allergen Reactions  . Ondansetron Hives, Itching and Rash       . Metoclopramide Hives, Itching and Rash    Level of Care/Admitting Diagnosis ED Disposition    ED Disposition Condition Comment   Admit  [facility] [[id number]]  Level of Care: Telemetry [5]  Admit to tele based on following criteria: Complex arrhythmia (Bradycardia/Tachycardia)  Covid Evaluation: Screening Protocol (No Symptoms)  Diagnosis: Sickle cell crisis [facility](HCC) [[id number]]) [[id number]]  Admitting Physician: Quentin AngstJEGEDE, OLUGBEMIGA E [[id number]]  Attending Physician: Quentin AngstJEGEDE, OLUGBEMIGA E [[id number]]  Estimated length of stay: past midnight tomorrow  Certification:: I certify this patient will need inpatient services for at least 2 midnights  PT Class (Do Not Modify): Inpatient [101]  PT Acc Code (Do Not Modify): Private [1]       B Medical/Surgery History Past Medical History:  Diagnosis Date  . Asthma   . Sickle cell anemia (HCC)    History reviewed. No pertinent surgical history.   A IV Location/Drains/Wounds Patient Lines/Drains/Airways Status   Active Line/Drains/Airways    Name:   Placement date:   Placement time:   Site:   Days:   Implanted Port  08/27/18 Left Chest   08/27/18    0524    Chest   26          Intake/Output Last 24 hours No intake or output data in the 24 hours ending 09/22/18 1421  Labs/Imaging Results for orders placed or performed during the hospital encounter of 09/22/18 (from the past 48 hour(s))  CBC with Differential/Platelet     Status: Abnormal   Collection Time: 09/22/18  2:15 AM  Result Value Ref Range   WBC 18.0 (H) 4.0 - 10.5 K/uL    Comment: WHITE COUNT CONFIRMED ON SMEAR   RBC 2.58 (L) 3.87 - 5.11 MIL/uL   Hemoglobin 8.5 (L) 12.0 - 15.0 g/dL   HCT 29.524.7 (L) 62.136.0 - 30.846.0 %   MCV 95.7 80.0 - 100.0 fL   MCH 32.9 26.0 - 34.0 pg   MCHC 34.4 30.0 - 36.0 g/dL   RDW 65.726.6 (H) 84.611.5 - 96.215.5 %   Platelets 422 (H) 150 - 400 K/uL   nRBC 6.4 (H) 0.0 - 0.2 %   Neutrophils Relative % 61 %   Neutro Abs 10.9 (H) 1.7 - 7.7 K/uL   Lymphocytes Relative 26 %   Lymphs Abs 4.7 (H) 0.7 - 4.0 K/uL   Monocytes Relative 10 %   Monocytes Absolute 1.8 (H) 0.1 - 1.0 K/uL   Eosinophils Relative 2 %   Eosinophils Absolute 0.4 0.0 - 0.5 K/uL   Basophils Relative 0 %  Basophils Absolute 0.1 0.0 - 0.1 K/uL   Immature Granulocytes 1 %   Abs Immature Granulocytes 0.16 (H) 0.00 - 0.07 K/uL   Carollee MassedHowell Jolly Bodies PRESENT    Polychromasia MARKED    Sickle Cells MARKED    Target Cells PRESENT     Comment: Performed at [facility], [address] [place], [place] [address]  Basic metabolic panel     Status: Abnormal   Collection Time: 09/22/18  2:15 AM  Result Value Ref Range   Sodium 137 135 - 145 mmol/L   Potassium 3.1 (L) 3.5 - 5.1 mmol/L   Chloride 106 98 - 111 mmol/L   CO2 22 22 - 32 mmol/L   Glucose, Bld 113 (H) 70 - 99 mg/dL   BUN 6 6 - 20 mg/dL   Creatinine, Ser 6.040.54 0.44 - 1.00 mg/dL   Calcium 8.5 (L) 8.9 - 10.3 mg/dL   GFR calc non Af Amer >60 >60 mL/min   GFR calc Af Amer >60 >60 mL/min   Anion gap 9 5 - 15    Comment: Performed at [facility], [address]  [place], [place] [address]  Reticulocytes     Status: Abnormal   Collection Time: 09/22/18  2:15 AM  Result Value Ref Range   Retic Ct Pct >30.0 (H) 0.4 - 3.1 %    Comment: RESULTS CONFIRMED BY MANUAL DILUTION   RBC. 2.58 (L) 3.87 - 5.11 MIL/uL   Retic Count, Absolute Not Measured 19.0 - 186.0 K/uL   Immature Retic Fract 7.7 2.3 - 15.9 %    Comment: Performed at [facility], [address] [place], [place] [address]  SARS Coronavirus 2 (CEPHEID - Performed in [facility] hospital lab), Hosp Order     Status: None   Collection Time: 09/22/18  2:15 AM  Result Value Ref Range   SARS Coronavirus 2 NEGATIVE NEGATIVE    Comment: (NOTE) If result is NEGATIVE SARS-CoV-2 target nucleic acids are NOT DETECTED. The SARS-CoV-2 RNA is generally detectable in upper and lower  respiratory specimens during the acute phase of infection. The lowest  concentration of SARS-CoV-2 viral copies this assay can detect is 250  copies / mL. A negative result does not preclude SARS-CoV-2 infection  and should not be used as the sole basis for treatment or other  patient management decisions.  A negative result may occur with  improper specimen collection / handling, submission of specimen other  than nasopharyngeal swab, presence of viral mutation(s) within the  areas targeted by this assay, and inadequate number of viral copies  (<250 copies / mL). A negative result must be combined with clinical  observations, patient history, and epidemiological information. If result is POSITIVE SARS-CoV-2 target nucleic acids are DETECTED. The SARS-CoV-2 RNA is generally detectable in upper and lower  respiratory specimens dur ing the acute phase of infection.  Positive  results are indicative of active infection with SARS-CoV-2.  Clinical  correlation with patient history and other diagnostic information is  necessary to determine patient infection status.  Positive results do  not rule out bacterial  infection or co-infection with other viruses. If result is PRESUMPTIVE POSTIVE SARS-CoV-2 nucleic acids MAY BE PRESENT.   A presumptive positive result was obtained on the submitted specimen  and confirmed on repeat testing.  While 2019 novel coronavirus  (SARS-CoV-2) nucleic acids may be present in the submitted sample  additional confirmatory testing may be necessary for epidemiological  and /  or clinical management purposes  to differentiate between  SARS-CoV-2 and other Sarbecovirus currently known to infect humans.  If clinically indicated additional testing with an alternate test  methodology [phone]([id number]) is advised. The SARS-CoV-2 RNA is generally  detectable in upper and lower respiratory sp ecimens during the acute  phase of infection. The expected result is Negative. Fact Sheet for Patients:  BoilerBrush.com.cyhttps://www.fda.gov/media/136312/download Fact Sheet for Healthcare Providers: https://pope.com/https://www.fda.gov/media/136313/download This test is not yet approved or cleared by the [place] FDA and has been authorized for detection and/or diagnosis of SARS-CoV-2 by FDA under an Emergency Use Authorization (EUA).  This EUA will remain in effect (meaning this test can be used) for the duration of the COVID-19 declaration under Section 564(b)(1) of the Act, 21 U.S.C. section 360bbb-3(b)(1), unless the authorization is terminated or revoked sooner. Performed at [facility], [address] [place], [place] [address]   Protime-INR     Status: None   Collection Time: 09/22/18  2:15 AM  Result Value Ref Range   Prothrombin Time 14.1 11.4 - 15.2 seconds   INR 1.1 0.8 - 1.2    Comment: (NOTE) INR goal varies based on device and disease states. Performed at [facility], [address] [place], [place] [address]   APTT     Status: None   Collection Time: 09/22/18  2:15 AM  Result Value Ref Range   aPTT 30 24 - 36 seconds    Comment: Performed at [facility], [address] [place], [place] [address]  I-Stat Beta hCG blood, ED ([facility], [facility], [facility] only)     Status: None   Collection Time: 09/22/18  2:23 AM  Result Value Ref Range   I-stat hCG, quantitative <5.0 <5 mIU/mL   Comment 3            Comment:   GEST. AGE      CONC.  (mIU/mL)   <=1 WEEK        5 - 50     2 WEEKS       50 - 500     3 WEEKS       100 - 10,000     4 WEEKS     1,000 - 30,000        FEMALE AND NON-PREGNANT FEMALE:     LESS THAN 5 mIU/mL    Ct Angio Chest Pe W/cm &/or Wo Cm  Result Date: 09/22/2018 CLINICAL DATA:  Sickle cell pain.  Shortness of breath.  Asthma. EXAM: CT ANGIOGRAPHY CHEST WITH CONTRAST TECHNIQUE: Multidetector CT imaging of the chest was performed using the standard protocol during bolus administration of intravenous contrast. Multiplanar CT image reconstructions and MIPs were obtained to evaluate the vascular anatomy. CONTRAST:  100mL OMNIPAQUE IOHEXOL 350 MG/ML SOLN COMPARISON:  Two-view chest x-ray 09/20/2018. CTA of the chest 09/08/2018. FINDINGS: Cardiovascular: Cardiac enlargement is again noted. No significant pericardial effusion is present. Minimal atherosclerotic changes are noted at the arch. Great vessel origins are within normal limits. Descending aorta is unremarkable. Pulmonary arterial opacification is excellent. There is a filling defect in the right upper lobe pulmonary artery suggesting pulmonary embolus. A partial filling defect is present in the posterior left lower lobe segmental branch on image 161 of series 5. Pulmonary artery enlargement is again noted. Bilateral Port-A-Caths are again noted. Mediastinum/Nodes: Mediastinal and bilateral hilar adenopathy is stable. Esophagus is unremarkable. Thoracic inlet is normal. Lungs/Pleura: Mosaic attenuation pattern is similar the prior exam. No significant  consolidation is present. No nodule or mass lesion is present. Upper Abdomen: Reflux into the IVC is again noted. Upper abdomen is  unremarkable. Musculoskeletal: Mild exaggerated thoracic kyphosis is present. Endplate changes compatible with sickle cell disease are again seen. No acute abnormalities are present. Review of the MIP images confirms the above findings. IMPRESSION: 1. Filling defects involving the right upper lobar pulmonary artery and a segmental branch of the left lower lobe. Findings suggest pulmonary embolus. 2. Enlarged pulmonary artery compatible with chronic pulmonary arterial hypertension. 3. Stable mosaic attenuation pattern of the lung is likely related to perfusion. 4. 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 Critical Value/emergent results were called by telephone at the time of interpretation on 09/22/2018 at 4:29 am to Dr. Shelby Dubin , who verbally acknowledged these results. Electronically Signed   By: San Morelle M.D.   On: 09/22/2018 04:34    Pending Labs Unresulted Labs (From admission, onward)    Start     Ordered   09/23/18 0500  CBC  Daily,   R     09/22/18 0510   09/22/18 1200  Heparin level (unfractionated)  Once-Timed,   R     09/22/18 0510   Signed and Held  Comprehensive metabolic panel  Tomorrow morning,   R     Signed and Held   Signed and Held  CBC with Differential/Platelet  Tomorrow morning,   R     Signed and Held          Vitals/Pain Today's Vitals   09/22/18 1300 09/22/18 1315 09/22/18 1330 09/22/18 1400  BP: 108/69  116/72 100/79  Pulse: 73  84 77  Resp: 14 18 18 16   Temp:      SpO2: 96%  99% 99%  PainSc:        Isolation Precautions No active isolations  Medications Medications  heparin ADULT infusion 100 units/mL (25000 units/228mL sodium chloride 0.45%) (1,000 Units/hr Intravenous New Bag/Given 09/22/18 0528)  HYDROmorphone (DILAUDID) injection 2 mg (2 mg Intravenous Given 09/22/18 1324)  HYDROmorphone (DILAUDID) injection 2 mg (2 mg Intravenous Given 09/22/18 0228)    Or  HYDROmorphone (DILAUDID) injection 2 mg (  Subcutaneous See Alternative 09/22/18 0228)  HYDROmorphone (DILAUDID) injection 2 mg (2 mg Intravenous Given 09/22/18 0333)    Or  HYDROmorphone (DILAUDID) injection 2 mg ( Subcutaneous See Alternative 09/22/18 0333)  HYDROmorphone (DILAUDID) injection 2 mg (2 mg Intravenous Given 09/22/18 0259)    Or  HYDROmorphone (DILAUDID) injection 2 mg ( Subcutaneous See Alternative 09/22/18 0259)  diphenhydrAMINE (BENADRYL) injection 25 mg (25 mg Intravenous Given 09/22/18 0226)  diphenhydrAMINE (BENADRYL) injection 25 mg (25 mg Intravenous Given 09/22/18 0330)  sodium chloride (PF) 0.9 % injection (10 mLs  Given 09/22/18 1121)  iohexol (OMNIPAQUE) 350 MG/ML injection 100 mL (100 mLs Intravenous Contrast Given 09/22/18 0403)  heparin bolus via infusion 3,000 Units (3,000 Units Intravenous Bolus from Bag 09/22/18 0530)  HYDROmorphone (DILAUDID) injection 2 mg (2 mg Intravenous Given 09/22/18 0906)    Mobility walks Low fall risk       R Recommendations: See Admitting Provider Note  Report given to:   Additional Notes:

## 2018-09-23 ENCOUNTER — Other Ambulatory Visit: Payer: Self-pay

## 2018-09-23 LAB — CBC WITH DIFFERENTIAL/PLATELET
Abs Immature Granulocytes: 0.06 10*3/uL (ref 0.00–0.07)
Basophils Absolute: 0.1 10*3/uL (ref 0.0–0.1)
Basophils Relative: 1 %
Eosinophils Absolute: 0.6 10*3/uL — ABNORMAL HIGH (ref 0.0–0.5)
Eosinophils Relative: 5 %
HCT: 20.7 % — ABNORMAL LOW (ref 36.0–46.0)
Hemoglobin: 7.3 g/dL — ABNORMAL LOW (ref 12.0–15.0)
Immature Granulocytes: 1 %
Lymphocytes Relative: 43 %
Lymphs Abs: 5.4 10*3/uL — ABNORMAL HIGH (ref 0.7–4.0)
MCH: 33.3 pg (ref 26.0–34.0)
MCHC: 35.3 g/dL (ref 30.0–36.0)
MCV: 94.5 fL (ref 80.0–100.0)
Monocytes Absolute: 1.1 10*3/uL — ABNORMAL HIGH (ref 0.1–1.0)
Monocytes Relative: 9 %
Neutro Abs: 5 10*3/uL (ref 1.7–7.7)
Neutrophils Relative %: 41 %
Platelets: 355 10*3/uL (ref 150–400)
RBC: 2.19 MIL/uL — ABNORMAL LOW (ref 3.87–5.11)
RDW: 24.5 % — ABNORMAL HIGH (ref 11.5–15.5)
WBC: 12.3 10*3/uL — ABNORMAL HIGH (ref 4.0–10.5)
nRBC: 1.1 % — ABNORMAL HIGH (ref 0.0–0.2)

## 2018-09-23 LAB — HEPARIN LEVEL (UNFRACTIONATED): Heparin Unfractionated: 0.14 IU/mL — ABNORMAL LOW (ref 0.30–0.70)

## 2018-09-23 MED ORDER — OXYCODONE HCL 5 MG PO TABS: 10.0000 mg | ORAL_TABLET | ORAL | Status: DC | PRN

## 2018-09-23 MED ORDER — RIVAROXABAN 15 MG PO TABS
15.0000 mg | ORAL_TABLET | Freq: Two times a day (BID) | ORAL | Status: DC
  Administered 2018-09-23 – 2018-09-24 (×3): 15 mg via ORAL
  Filled 2018-09-23 (×3): qty 1

## 2018-09-23 MED ORDER — RIVAROXABAN 20 MG PO TABS: 20.0000 mg | ORAL_TABLET | Freq: Every day | ORAL | Status: DC

## 2018-09-23 NOTE — Progress Notes (Signed)
ANTICOAGULATION CONSULT NOTE - Follow Up Consult  Pharmacy Consult for heparin - transition to Xarelto 6/11 Indication: acute pulmonary embolus  Allergies  Allergen Reactions  . Ondansetron Hives, Itching and Rash       . Metoclopramide Hives, Itching and Rash    Patient Measurements: weight 68 kg, height 59 inches   Heparin Dosing Weight: 68 kg  Vital Signs: Temp: 98.1 F (36.7 C) (06/11 1449) Temp Source: Oral (06/11 1449) BP: 112/80 (06/11 1449) Pulse Rate: 78 (06/11 1449)  Labs: Recent Labs    09/22/18 0215 09/22/18 1401 09/22/18 2004 09/23/18 0918  HGB 8.5*  --   --  7.3*  HCT 24.7*  --   --  20.7*  PLT 422*  --   --  355  APTT 30  --   --   --   LABPROT 14.1  --   --   --   INR 1.1  --   --   --   HEPARINUNFRC  --  0.30 0.38  --   CREATININE 0.54  --   --   --     Estimated Creatinine Clearance: 81.5 mL/min (by C-G formula based on SCr of 0.54 mg/dL).   Assessment: Patient's a 38 y.o F with hx sickle cell anemia presented to the ED on 6/10 for sickle cell pain crisis. Chest CTA on 6/10 showed findings consistent with acute PE.  Heparin started on admission for PE.  Today, 09/23/2018: - Heparin level pending finally now - patient hard stick per RN, but now to change IV heparin to Xarelto - no bleeding or infusion issues per RN - Hgb 7.3 down (note sickle cell). Plts 355k - SCr 0.54 CrCl 82  Goal of Therapy:  Heparin level 0.3-0.7 units/ml Monitor platelets by anticoagulation protocol: Yes   Plan:  1) Discontinue IV heparin now 2) Start Xarelto 15mg  BID x 21 days then start 20mg  once daily 3) Will counsel patient prior to discharge and provide education materials   Kara Mead 09/23/2018,2:51 PM

## 2018-09-23 NOTE — Progress Notes (Signed)
Subjective: Joanna [name], a 38 year old female with a medical history significant for sickle cell disease, chronic pain syndrome, opiate dependence, tobacco dependence, mild intermittent asthma, history of hypoxia, and schizophrenia was admitted for sickle cell pain crisis and pulmonary embolism.  Patient states that pain intensity has improved overnight.  Pain is primarily to lower extremities and low back.  Current pain intensity is 6/10 characterized as intermittent and aching.  Patient has a PE right upper lobar pulmonary artery and segmental branch of the lower lobe.  She has been continued on heparin drip overnight.  No signs of bleeding.  Patient afebrile and maintaining oxygen saturation at 100% on RA.  Patient denies headache, chest pains, shortness of breath, dysuria, abdominal pain, nausea, vomiting, or diarrhea. Objective:  Vital signs in last 24 hours:  Vitals:   09/23/18 0737 09/23/18 0753 09/23/18 1038 09/23/18 1240  BP:   (!) 90/59   Pulse:   76   Resp: 12 (!) 8 12 16   Temp:      TempSrc:      SpO2: 98% 97% 100%     Intake/Output from previous day:   Intake/Output Summary (Last 24 hours) at 09/23/2018 1245 Last data filed at 09/23/2018 0321 Gross per 24 hour  Intake 1462.27 ml  Output -  Net 1462.27 ml    Physical Exam: General: Alert, awake, oriented x3, in no acute distress.  HEENT: [place]/AT PEERL, EOMI Neck: Trachea midline,  no masses, no thyromegal,y no JVD, no carotid bruit OROPHARYNX:  Moist, No exudate/ erythema/lesions.  Heart: Regular rate and rhythm, without murmurs, rubs, gallops, PMI non-displaced, no heaves or thrills on palpation.  Lungs: Clear to auscultation, no wheezing or rhonchi noted. No increased vocal fremitus resonant to percussion  Abdomen: Soft, nontender, nondistended, positive bowel sounds, no masses no hepatosplenomegaly noted..  Neuro: No focal neurological deficits noted cranial nerves II through XII grossly intact. DTRs 2+  bilaterally upper and lower extremities. Strength 5 out of 5 in bilateral upper and lower extremities. Musculoskeletal: No warm swelling or erythema around joints, no spinal tenderness noted. Psychiatric: Patient alert and oriented x3, good insight and cognition, good recent to remote recall. Lymph node survey: No cervical axillary or inguinal lymphadenopathy noted.  Lab Results:  Basic Metabolic Panel:    Component Value Date/Time   NA 137 09/22/2018 0215   K 3.1 (L) 09/22/2018 0215   CL 106 09/22/2018 0215   CO2 22 09/22/2018 0215   BUN 6 09/22/2018 0215   CREATININE 0.54 09/22/2018 0215   GLUCOSE 113 (H) 09/22/2018 0215   CALCIUM 8.5 (L) 09/22/2018 0215   CBC:    Component Value Date/Time   WBC 12.3 (H) 09/23/2018 0918   HGB 7.3 (L) 09/23/2018 0918   HCT 20.7 (L) 09/23/2018 0918   PLT 355 09/23/2018 0918   MCV 94.5 09/23/2018 0918   NEUTROABS 5.0 09/23/2018 0918   LYMPHSABS 5.4 (H) 09/23/2018 0918   MONOABS 1.1 (H) 09/23/2018 0918   EOSABS 0.6 (H) 09/23/2018 0918   BASOSABS 0.1 09/23/2018 0918    Recent Results (from the past 240 hour(s))  SARS Coronavirus 2 (CEPHEID - Performed in [facility] hospital lab), Hosp Order     Status: None   Collection Time: 09/22/18  2:15 AM   Specimen: Nasopharyngeal Swab  Result Value Ref Range Status   SARS Coronavirus 2 NEGATIVE NEGATIVE Final    Comment: (NOTE) If result is NEGATIVE SARS-CoV-2 target nucleic acids are NOT DETECTED. The SARS-CoV-2 RNA is generally detectable in upper  and lower  respiratory specimens during the acute phase of infection. The lowest  concentration of SARS-CoV-2 viral copies this assay can detect is 250  copies / mL. A negative result does not preclude SARS-CoV-2 infection  and should not be used as the sole basis for treatment or other  patient management decisions.  A negative result may occur with  improper specimen collection / handling, submission of specimen other  than nasopharyngeal swab,  presence of viral mutation(s) within the  areas targeted by this assay, and inadequate number of viral copies  (<250 copies / mL). A negative result must be combined with clinical  observations, patient history, and epidemiological information. If result is POSITIVE SARS-CoV-2 target nucleic acids are DETECTED. The SARS-CoV-2 RNA is generally detectable in upper and lower  respiratory specimens dur ing the acute phase of infection.  Positive  results are indicative of active infection with SARS-CoV-2.  Clinical  correlation with patient history and other diagnostic information is  necessary to determine patient infection status.  Positive results do  not rule out bacterial infection or co-infection with other viruses. If result is PRESUMPTIVE POSTIVE SARS-CoV-2 nucleic acids MAY BE PRESENT.   A presumptive positive result was obtained on the submitted specimen  and confirmed on repeat testing.  While 2019 novel coronavirus  (SARS-CoV-2) nucleic acids may be present in the submitted sample  additional confirmatory testing may be necessary for epidemiological  and / or clinical management purposes  to differentiate between  SARS-CoV-2 and other Sarbecovirus currently known to infect humans.  If clinically indicated additional testing with an alternate test  methodology [phone]) is advised. The SARS-CoV-2 RNA is generally  detectable in upper and lower respiratory sp ecimens during the acute  phase of infection. The expected result is Negative. Fact Sheet for Patients:  StrictlyIdeas.no Fact Sheet for Healthcare Providers: BankingDealers.co.za This test is not yet approved or cleared by the [place] FDA and has been authorized for detection and/or diagnosis of SARS-CoV-2 by FDA under an Emergency Use Authorization (EUA).  This EUA will remain in effect (meaning this test can be used) for the duration of the COVID-19 declaration under  Section 564(b)(1) of the Act, 21 U.S.C. section 360bbb-3(b)(1), unless the authorization is terminated or revoked sooner. Performed at [facility], [place] [address] [place], [place] [address]     Studies/Results: Ct Angio Chest Pe W/cm &/or Wo Cm  Result Date: 09/22/2018 CLINICAL DATA:  Sickle cell pain.  Shortness of breath.  Asthma. EXAM: CT ANGIOGRAPHY CHEST WITH CONTRAST TECHNIQUE: Multidetector CT imaging of the chest was performed using the standard protocol during bolus administration of intravenous contrast. Multiplanar CT image reconstructions and MIPs were obtained to evaluate the vascular anatomy. CONTRAST:  11mL OMNIPAQUE IOHEXOL 350 MG/ML SOLN COMPARISON:  Two-view chest x-ray 09/20/2018. CTA of the chest 09/08/2018. FINDINGS: Cardiovascular: Cardiac enlargement is again noted. No significant pericardial effusion is present. Minimal atherosclerotic changes are noted at the arch. Great vessel origins are within normal limits. Descending aorta is unremarkable. Pulmonary arterial opacification is excellent. There is a filling defect in the right upper lobe pulmonary artery suggesting pulmonary embolus. A partial filling defect is present in the posterior left lower lobe segmental branch on image 161 of series 5. Pulmonary artery enlargement is again noted. Bilateral Port-A-Caths are again noted. Mediastinum/Nodes: Mediastinal and bilateral hilar adenopathy is stable. Esophagus is unremarkable. Thoracic inlet is normal. Lungs/Pleura: Mosaic attenuation pattern is similar the prior exam. No significant consolidation is present. No  nodule or mass lesion is present. Upper Abdomen: Reflux into the IVC is again noted. Upper abdomen is unremarkable. Musculoskeletal: Mild exaggerated thoracic kyphosis is present. Endplate changes compatible with sickle cell disease are again seen. No acute abnormalities are present. Review of the MIP images confirms the above findings. IMPRESSION: 1.  Filling defects involving the right upper lobar pulmonary artery and a segmental branch of the left lower lobe. Findings suggest pulmonary embolus. 2. Enlarged pulmonary artery compatible with chronic pulmonary arterial hypertension. 3. Stable mosaic attenuation pattern of the lung is likely related to perfusion. 4. 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 Critical Value/emergent results were called by telephone at the time of interpretation on 09/22/2018 at 4:29 am to Dr. Ralph LeydenBRITNI HENDERLY , who verbally acknowledged these results. Electronically Signed   By: Marin Robertshristopher  Mattern M.D.   On: 09/22/2018 04:34    Medications: Scheduled Meds: . DULoxetine  30 mg Oral QHS  . DULoxetine  60 mg Oral Daily  . fluticasone  1 spray Each Nare Daily  . folic acid  1 mg Oral Daily  . gabapentin  100 mg Oral TID  . HYDROmorphone   Intravenous Q4H  . hydroxyurea  1,000 mg Oral Daily  . ketorolac  15 mg Intravenous Q6H  . methadone  5 mg Oral Q12H  . risperiDONE  2 mg Oral BID  . senna-docusate  1 tablet Oral BID   Continuous Infusions: . sodium chloride 100 mL/hr at 09/23/18 1236  . heparin 1,000 Units/hr (09/23/18 0329)   PRN Meds:.albuterol, diphenhydrAMINE, naloxone **AND** sodium chloride flush, oxyCODONE, polyethylene glycol  Consultants:  Heparin drip per pharmacy consult   Assessment/Plan: Active Problems:   Tobacco abuse   Sickle cell crisis (HCC)   Hypoxia   Pulmonary embolism (HCC)   Chronic pain syndrome   Leukocytosis  Sickle cell anemia with pain crisis: [facility] Weaning weight-based Dilaudid PCA.  Settings of 0.5 mg today, 10-minute lockout, and 2 mg/h IV Toradol 15 mg every 6 hours Maintain oxygen saturation above 90%, 2 L supplemental oxygen as needed Continue to monitor vital signs closely Reevaluate pain scale regularly Incentive spirometer  Pulmonary embolism: Patient to transition to Xarelto per pharmacy  consult. Possible discharge in a.m.  Leukocytosis: WBCs improved from previous.  No signs of infection or inflammation.  Suspected to be reactive.  Continue to monitor closely. Continue to follow CBC.  Sickle cell anemia: Hemoglobin stable.  No clinical indication for blood transfusion at this time. CBC in a.m.  Chronic pain syndrome: Methadone 5 mg every 12 hours Oxycodone 10 mg every 4 hours as needed for moderate to severe pain  History of schizophrenia: Stable.  Continue home medications.  Patient denies visual or auditory hallucinations.  Hypokalemia: Improved.  Continue to replete potassium. BMP in a.m.   Code Status: Full Code Family Communication: N/A Disposition Plan: Not yet ready for discharge  Takaya Hyslop Rennis PettyMoore Koralee Wedeking  APRN, MSN, FNP-C [facility] [address]  [place], [place] [id number] [phone]  If 5PM-7AM, please contact night-coverage.  09/23/2018, 12:45 PM  LOS: 1 day

## 2018-09-23 NOTE — TOC Initial Note (Signed)
Transition of Care (TOC) - Initial/Assessment Note    Patient Details  Name: Joanna [name] MRN: [id number] Date of Birth: [birth year]  Transition of Care [facility]) CM/SW Contact:    Purcell Mouton, RN Phone Number: 09/23/2018, 1:09 PM  Clinical Narrative:                 Pt admitted in Sickle Cell pain Crisis  Expected Discharge Plan: Home/Self Care     Patient Goals and CMS Choice Patient states their goals for this hospitalization and ongoing recovery are:: to get better      Expected Discharge Plan and Services Expected Discharge Plan: Home/Self Care   Discharge Planning Services: CM Consult   Living arrangements for the past 2 months: Single Family Home Expected Discharge Date: (unknown)                                    Prior Living Arrangements/Services Living arrangements for the past 2 months: Single Family Home Lives with:: Parents Patient language and need for interpreter reviewed:: No Do you feel safe going back to the place where you live?: Yes               Activities of Daily Living Home Assistive Devices/Equipment: None ADL Screening (condition at time of admission) Patient's cognitive ability adequate to safely complete daily activities?: Yes Is the patient deaf or have difficulty hearing?: No Does the patient have difficulty seeing, even when wearing glasses/contacts?: No Does the patient have difficulty concentrating, remembering, or making decisions?: No Patient able to express need for assistance with ADLs?: Yes Does the patient have difficulty dressing or bathing?: No Independently performs ADLs?: Yes (appropriate for developmental age) Does the patient have difficulty walking or climbing stairs?: No Weakness of Legs: None Weakness of Arms/Hands: None  Permission Sought/Granted                  Emotional Assessment Appearance:: Appears stated age     Orientation: : Oriented to Self, Oriented to Place, Oriented to  Time,  Oriented to Situation      Admission diagnosis:  Sickle cell pain crisis ([place]) [D57.00] Multiple subsegmental pulmonary emboli without acute cor pulmonale [I26.94] Sickle cell crisis ([place]) [D57.00] Patient Active Problem List   Diagnosis Date Noted  . Pulmonary embolism ([place]) 09/22/2018  . Chronic pain syndrome 09/22/2018  . Leukocytosis 09/22/2018  . Hypoxia   . Community acquired pneumonia   . Sickle cell crisis ([place]) 09/28/2017  . Sickle cell anemia with pain ([place]) 09/28/2017  . Sickle cell pain crisis ([place]) 09/21/2017  . Abdominal pain 09/21/2017  . Tobacco abuse 09/21/2017   PCP:  Patient, No Pcp Per Pharmacy:   [facility] [place], [place] [address] Phone: [phone] Fax: [phone]  [place], [place] [address] [place] [address] Phone: [phone] Fax: [phone]  [place], [place] [address] [place] [address] Phone: [phone] Fax: [phone]     Social Determinants of Health (SDOH) Interventions    Readmission Risk Interventions No flowsheet data found.

## 2018-09-24 DIAGNOSIS — Z720 Tobacco use: Secondary | ICD-10-CM

## 2018-09-24 DIAGNOSIS — R0902 Hypoxemia: Secondary | ICD-10-CM

## 2018-09-24 DIAGNOSIS — I2693 Single subsegmental pulmonary embolism without acute cor pulmonale: Secondary | ICD-10-CM

## 2018-09-24 DIAGNOSIS — D5700 Hb-SS disease with crisis, unspecified: Principal | ICD-10-CM

## 2018-09-24 DIAGNOSIS — D72829 Elevated white blood cell count, unspecified: Secondary | ICD-10-CM

## 2018-09-24 DIAGNOSIS — G894 Chronic pain syndrome: Secondary | ICD-10-CM

## 2018-09-24 DIAGNOSIS — I2694 Multiple subsegmental pulmonary emboli without acute cor pulmonale: Secondary | ICD-10-CM

## 2018-09-24 LAB — CBC
HCT: 21 % — ABNORMAL LOW (ref 36.0–46.0)
Hemoglobin: 7.5 g/dL — ABNORMAL LOW (ref 12.0–15.0)
MCH: 33.3 pg (ref 26.0–34.0)
MCHC: 35.7 g/dL (ref 30.0–36.0)
MCV: 93.3 fL (ref 80.0–100.0)
Platelets: 358 10*3/uL (ref 150–400)
RBC: 2.25 MIL/uL — ABNORMAL LOW (ref 3.87–5.11)
RDW: 22.3 % — ABNORMAL HIGH (ref 11.5–15.5)
WBC: 9.8 10*3/uL (ref 4.0–10.5)
nRBC: 0.7 % — ABNORMAL HIGH (ref 0.0–0.2)

## 2018-09-24 MED ORDER — RIVAROXABAN 20 MG PO TABS: 20.0000 mg | ORAL_TABLET | Freq: Every day | ORAL | 1 refills | Status: AC

## 2018-09-24 MED ORDER — RIVAROXABAN 15 MG PO TABS: 15.0000 mg | ORAL_TABLET | Freq: Two times a day (BID) | ORAL | 0 refills | Status: AC

## 2018-09-24 MED ORDER — HEPARIN SOD (PORK) LOCK FLUSH 100 UNIT/ML IV SOLN
500.0000 [IU] | INTRAVENOUS | Status: AC | PRN
  Administered 2018-09-24: 12:00:00 500 [IU]

## 2018-09-24 MED ORDER — SODIUM CHLORIDE 0.9% FLUSH: 10.0000 mL | INTRAVENOUS | Status: DC | PRN

## 2018-09-24 NOTE — Discharge Summary (Signed)
Physician Discharge Summary  Joanna [name] PQZ:[id number] DOB: May 19, 1980 DOA: 09/22/2018  PCP: Patient, No Pcp Per  Admit date: 09/22/2018  Discharge date: 09/24/2018  Discharge Diagnoses:  Active Problems:   Tobacco abuse   Sickle cell crisis (HCC)   Hypoxia   Pulmonary embolism (HCC)   Chronic pain syndrome   Leukocytosis  Discharge Condition: Stable  Disposition:  Follow-up [facility].   Specialty: Internal Medicine Contact information: [place] [address] [phone]       Dorena Dew, FNP. Go in 4 day(s).   Specialty: Family Medicine Contact information: [address] [place] [address] [phone]          Pt is discharged home in good condition and is to follow up with Patient, No Pcp Per this week to have labs evaluated. Joanna [name] is instructed to increase activity slowly and balance with rest for the next few days, and use prescribed medication to complete treatment of pain  Diet: Regular Wt Readings from Last 3 Encounters:  09/23/18 68 kg  09/20/18 68 kg  09/18/18 72 kg    History of present illness:  Joanna [name]  is a 38 y.o. female with a medical history significant for sickle cell disease, chronic pain syndrome, opiate dependence, tobacco dependence, mild intermittent asthma, history of hypoxia, and schizophrenia presented to ER with complaints of increasing upper and lower extremity pain.  Patient was treated and evaluated in the ER on 09/20/2018 for this problem, but left AGAINST MEDICAL ADVICE prior to admission.  Patient states that she had a sudden increase in pain late last night that was unrelieved by home medications.  Patient is typically on methadone 5 mg every 12 hours and oxycodone.  She endorses some shortness of breath. Current pain intensity 7/10 characterized as constant and throbbing primarily to upper and lower extremities.  Patient denies  dizziness, chest pain, headache, blurred vision, nausea, vomiting, or diarrhea. She also denies sick contacts, recent travel, or exposure to COVID-19.  ER course: WBCs 18.0, improved from previous.  Hemoglobin 8.5, platelets 422, potassium 3.1, and creatinine 0.54. Patient underwent CT angiogram, which showed filling defects in the right upper lobar pulmonary artery and segmental branch of the left lower lobe.  The findings suggest pulmonary embolism.  Also, enlarged pulmonary artery compatible with chronic pulmonary arterial hypertension.  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  Initially, oxygen saturation 89% on RA, improved to 94-100% on 2 L supplemental oxygen.  Patient afebrile.  COVID-19 test negative.  [place] Pain persists despite multiple doses of IV Dilaudid, IV fluids, and IV Toradol.  Patient admitted to telemetry for sickle cell pain crisis and pulmonary embolism.  Hospital Course:  Patient was admitted for sickle cell pain crisis and pulmonary embolism and was managed appropriately with IVF, IV Dilaudid via PCA and IV Toradol, as well as other adjunct therapies per sickle cell pain management protocols. Pulmonary embolism was initially managed on IV heparin infusion per pharmacy consult protocol which was later changed to p.o. Xarelto starting at 15 mg twice daily for 21 days, to be continued with 20 mg daily thereafter.  Patient improved significantly, pain returned to baseline, saturating well above 95% consistently on room air, ambulating well without being short of breath. Patient remained hemodynamically stable throughout this admission. Patient did not require blood transfusion during this admission. Patient was discharged home today in a hemodynamically  stable condition. [facility] and for ongoing management of sickle cell disease. She was given instruction on her Xarelto, to complete the 21 days of 15 mg  BiD, and to start 20 mg daily from October 13, 2018. She was given prescription for both. Patient claims she has her home pain medications.  Discharge Exam: Vitals:   09/24/18 0514 09/24/18 0846  BP: 91/65   Pulse: 71   Resp: (!) 9 10  Temp: 98 F (36.7 C)   SpO2: 98% 96%   Vitals:   09/24/18 0104 09/24/18 0430 09/24/18 0514 09/24/18 0846  BP: (!) 89/49  91/65   Pulse: 80  71   Resp: (!) 8 (!) 9 (!) 9 10  Temp: 98.1 F (36.7 C)  98 F (36.7 C)   TempSrc:   Oral   SpO2: 96% 94% 98% 96%  Weight:      Height:        General appearance : Awake, alert, not in any distress. Speech Clear. Not toxic looking HEENT: Atraumatic and Normocephalic, pupils equally reactive to light and accomodation Neck: Supple, no JVD. No cervical lymphadenopathy.  Chest: Good air entry bilaterally, no added sounds  CVS: S1 S2 regular, no murmurs.  Abdomen: Bowel sounds present, Non tender and not distended with no gaurding, rigidity or rebound. Extremities: B/L Lower Ext shows no edema, both legs are warm to touch Neurology: Awake alert, and oriented X 3, CN II-XII intact, Non focal Skin: No Rash  Discharge Instructions  Discharge Instructions    Diet - low sodium heart healthy   Complete by: As directed    Increase activity slowly   Complete by: As directed      Allergies as of 09/24/2018      Reactions   Ondansetron Hives, Itching, Rash      Metoclopramide Hives, Itching, Rash      Medication List    TAKE these medications   DULoxetine 30 MG capsule Commonly known as: CYMBALTA Take 30-60 mg by mouth See admin instructions. Take 2 capsules (59m) in the morning and 1 capsule (310m in the evening.   fluticasone 50 MCG/ACT nasal spray Commonly known as: FLONASE Place 1 spray into the nose daily as needed for allergies.   folic acid 1 MG tablet Commonly known as: FOLVITE Take 1 mg by mouth daily.   gabapentin 100 MG capsule Commonly known as: NEURONTIN Take 100 mg by mouth 3 (three)  times daily.   hydroxyurea 100 mg/mL Susp Commonly known as: HYDREA Take 1,000 mg by mouth daily.   methadone 5 MG tablet Commonly known as: DOLOPHINE Take 5 mg by mouth every 12 (twelve) hours.   Narcan 4 MG/0.1ML Liqd nasal spray kit Generic drug: naloxone Place 1 spray into the nose once as needed (opiod overdose).   Oxycodone HCl 10 MG Tabs Take 10 mg by mouth every 4 (four) hours as needed (pain).   promethazine 25 MG tablet Commonly known as: PHENERGAN Take 1 tablet (25 mg total) by mouth every 6 (six) hours as needed for nausea or vomiting.   Proventil HFA 108 (90 Base) MCG/ACT inhaler Generic drug: albuterol Inhale 2 puffs into the lungs every 6 (six) hours as needed for wheezing.   risperiDONE 2 MG tablet Commonly known as: RISPERDAL Take 2 mg by mouth 2 (two) times daily.   Rivaroxaban 15 MG Tabs tablet Commonly known as: XARELTO Take 1 tablet (15 mg total) by mouth 2 (two)  times daily.   rivaroxaban 20 MG Tabs tablet Commonly known as: XARELTO Take 1 tablet (20 mg total) by mouth daily with supper. Start taking on: October 14, 2018       The results of significant diagnostics from this hospitalization (including imaging, microbiology, ancillary and laboratory) are listed below for reference.    Significant Diagnostic Studies: Dg Chest 2 View  Result Date: 09/20/2018 CLINICAL DATA:  Chest pain.  Sickle cell disease. EXAM: CHEST - 2 VIEW COMPARISON:  Chest x-ray and chest CT 09/08/2018 FINDINGS: Stable cardiac enlargement, vascular congestion and probable pulmonary edema. No pleural effusions or focal infiltrates. Right and left Port-A-Cath are noted. No change. The bony thorax is intact. IMPRESSION: Cardiac enlargement, vascular congestion and mild interstitial edema. No definite infiltrates or effusions. Electronically Signed   By: Marijo Sanes M.D.   On: 09/20/2018 11:03   Ct Angio Chest Pe W/cm &/or Wo Cm  Result Date: 09/22/2018 CLINICAL DATA:  Sickle cell  pain.  Shortness of breath.  Asthma. EXAM: CT ANGIOGRAPHY CHEST WITH CONTRAST TECHNIQUE: Multidetector CT imaging of the chest was performed using the standard protocol during bolus administration of intravenous contrast. Multiplanar CT image reconstructions and MIPs were obtained to evaluate the vascular anatomy. CONTRAST:  112m OMNIPAQUE IOHEXOL 350 MG/ML SOLN COMPARISON:  Two-view chest x-ray 09/20/2018. CTA of the chest 09/08/2018. FINDINGS: Cardiovascular: Cardiac enlargement is again noted. No significant pericardial effusion is present. Minimal atherosclerotic changes are noted at the arch. Great vessel origins are within normal limits. Descending aorta is unremarkable. Pulmonary arterial opacification is excellent. There is a filling defect in the right upper lobe pulmonary artery suggesting pulmonary embolus. A partial filling defect is present in the posterior left lower lobe segmental branch on image 161 of series 5. Pulmonary artery enlargement is again noted. Bilateral Port-A-Caths are again noted. Mediastinum/Nodes: Mediastinal and bilateral hilar adenopathy is stable. Esophagus is unremarkable. Thoracic inlet is normal. Lungs/Pleura: Mosaic attenuation pattern is similar the prior exam. No significant consolidation is present. No nodule or mass lesion is present. Upper Abdomen: Reflux into the IVC is again noted. Upper abdomen is unremarkable. Musculoskeletal: Mild exaggerated thoracic kyphosis is present. Endplate changes compatible with sickle cell disease are again seen. No acute abnormalities are present. Review of the MIP images confirms the above findings. IMPRESSION: 1. Filling defects involving the right upper lobar pulmonary artery and a segmental branch of the left lower lobe. Findings suggest pulmonary embolus. 2. Enlarged pulmonary artery compatible with chronic pulmonary arterial hypertension. 3. Stable mosaic attenuation pattern of the lung is likely related to perfusion. 4. Similar  appearance of IVC reflux suggesting chronic right heart failure. Critical Value/emergent results were called by telephone at the time of interpretation on 09/22/2018 at 4:29 am to Dr. BShelby Dubin, who verbally acknowledged these results. Electronically Signed   By: CSan MorelleM.D.   On: 09/22/2018 04:34   Ct Angio Chest Pe W And/or Wo Contrast  Result Date: 09/08/2018 CLINICAL DATA:  Sickle cell anemia. Hypoxia. EXAM: CT ANGIOGRAPHY CHEST WITH CONTRAST TECHNIQUE: Multidetector CT imaging of the chest was performed using the standard protocol during bolus administration of intravenous contrast. Multiplanar CT image reconstructions and MIPs were obtained to evaluate the vascular anatomy. CONTRAST:  548mOMNIPAQUE IOHEXOL 350 MG/ML SOLN COMPARISON:  Chest radiograph from earlier today. FINDINGS: Cardiovascular: The study is moderate to high quality for the evaluation of pulmonary embolism, with slightly suboptimal bolus opacification. There are no filling defects in the central, lobar, segmental or  subsegmental pulmonary artery branches to suggest acute pulmonary embolism. Normal course and caliber of the thoracic aorta. Dilated main pulmonary artery (4.1 cm diameter). Mild cardiomegaly. No significant pericardial fluid/thickening. Left internal jugular Port-A-Cath terminates at the cavoatrial junction. Right internal jugular Port-A-Cath terminates at the cavoatrial junction. Mediastinum/Nodes: No discrete thyroid nodules. Unremarkable esophagus. No axillary adenopathy. Enlarged 1.7 cm right paratracheal node (series 6/image 35). Mild bilateral hilar adenopathy, largest 1.0 cm on the right (series 6/image 41) and 1.0 cm on the left (series 6/image 49). Lungs/Pleura: No pneumothorax. No pleural effusion. No acute consolidative airspace disease, lung masses or significant pulmonary nodules. Mosaic attenuation throughout both lungs. Scattered parenchymal bands at both lung bases compatible with mild  scarring or atelectasis. Upper abdomen: Contrast reflux into IVC and hepatic veins. Autosplenectomy. Musculoskeletal: No aggressive appearing focal osseous lesions. Minimal endplate concavity in the midthoracic vertebral bodies. Review of the MIP images confirms the above findings. IMPRESSION: 1. No evidence of pulmonary embolism. 2. Prominently dilated main pulmonary artery, compatible with pulmonary arterial hypertension. 3. Cardiomegaly. Contrast reflux into the IVC and hepatic veins, suggesting right heart failure. 4. Mosaic attenuation throughout both lungs, nonspecific, probably due to mosaic perfusion from pulmonary vascular disease. 5. Mild mediastinal and bilateral hilar lymphadenopathy, nonspecific, most likely reactive. Electronically Signed   By: Ilona Sorrel M.D.   On: 09/08/2018 20:37   Dg Chest Port 1 View  Result Date: 09/08/2018 CLINICAL DATA:  Sickle cell crisis with back pain. EXAM: PORTABLE CHEST 1 VIEW COMPARISON:  11/19/2017 FINDINGS: Cardiomegaly that is chronic. There is diffuse interstitial opacity that was also seen previously. The hila are congested. No pleural effusion noted. No pneumothorax. Bilateral porta catheter with tips at the upper cavoatrial junction. IMPRESSION: Cardiomegaly and pulmonary opacity that is stable from 11/19/2017. The opacity could be from scarring or recurrent pulmonary edema. Electronically Signed   By: Monte Fantasia M.D.   On: 09/08/2018 04:46   Vas [place] Lower Extremity Venous (dvt) (only [facility])  Result Date: 09/08/2018  Lower Venous Study Indications: Edema.  Performing Technologist: Abram Sander RVS  Examination Guidelines: A complete evaluation includes B-mode imaging, spectral Doppler, color Doppler, and power Doppler as needed of all accessible portions of each vessel. Bilateral testing is considered an integral part of a complete examination. Limited examinations for reoccurring indications may be performed as noted.   +---------+---------------+---------+-----------+----------+--------------+ RIGHT    CompressibilityPhasicitySpontaneityPropertiesSummary        +---------+---------------+---------+-----------+----------+--------------+ CFV      Full           Yes      Yes                                 +---------+---------------+---------+-----------+----------+--------------+ SFJ      Full                                                        +---------+---------------+---------+-----------+----------+--------------+ FV Prox  Full                                                        +---------+---------------+---------+-----------+----------+--------------+ FV  Mid   Full                                                        +---------+---------------+---------+-----------+----------+--------------+ FV DistalFull                                                        +---------+---------------+---------+-----------+----------+--------------+ PFV      Full                                                        +---------+---------------+---------+-----------+----------+--------------+ POP      Full           Yes      Yes                                 +---------+---------------+---------+-----------+----------+--------------+ PTV      Full                                                        +---------+---------------+---------+-----------+----------+--------------+ PERO                                                  Not visualized +---------+---------------+---------+-----------+----------+--------------+   +----+---------------+---------+-----------+----------+-------+ LEFTCompressibilityPhasicitySpontaneityPropertiesSummary +----+---------------+---------+-----------+----------+-------+ CFV Full           Yes      Yes                          +----+---------------+---------+-----------+----------+-------+     Summary: Right: There is no  evidence of deep vein thrombosis in the lower extremity. No cystic structure found in the popliteal fossa. Left: No evidence of common femoral vein obstruction.  *See table(s) above for measurements and observations. Electronically signed by Harold Barban MD on 09/08/2018 at 1:09:19 PM.    Final     Microbiology: Recent Results (from the past 240 hour(s))  SARS Coronavirus 2 (CEPHEID - Performed in [place] hospital lab), Hosp Order     Status: None   Collection Time: 09/22/18  2:15 AM   Specimen: Nasopharyngeal Swab  Result Value Ref Range Status   SARS Coronavirus 2 NEGATIVE NEGATIVE Final    Comment: (NOTE) If result is NEGATIVE SARS-CoV-2 target nucleic acids are NOT DETECTED. The SARS-CoV-2 RNA is generally detectable in upper and lower  respiratory specimens during the acute phase of infection. The lowest  concentration of SARS-CoV-2 viral copies this assay can detect is 250  copies / mL. A negative result does not preclude SARS-CoV-2 infection  and should not be used as the sole basis for treatment or other  patient management decisions.  A negative result may occur with  improper specimen collection / handling, submission of specimen other  than nasopharyngeal swab, presence of viral mutation(s) within the  areas targeted by this assay, and inadequate number of viral copies  (<250 copies / mL). A negative result must be combined with clinical  observations, patient history, and epidemiological information. If result is POSITIVE SARS-CoV-2 target nucleic acids are DETECTED. The SARS-CoV-2 RNA is generally detectable in upper and lower  respiratory specimens dur ing the acute phase of infection.  Positive  results are indicative of active infection with SARS-CoV-2.  Clinical  correlation with patient history and other diagnostic information is  necessary to determine patient infection status.  Positive results do  not rule out bacterial infection or co-infection with other  viruses. If result is PRESUMPTIVE POSTIVE SARS-CoV-2 nucleic acids MAY BE PRESENT.   A presumptive positive result was obtained on the submitted specimen  and confirmed on repeat testing.  While 2019 novel coronavirus  (SARS-CoV-2) nucleic acids may be present in the submitted sample  additional confirmatory testing may be necessary for epidemiological  and / or clinical management purposes  to differentiate between  SARS-CoV-2 and other Sarbecovirus currently known to infect humans.  If clinically indicated additional testing with an alternate test  methodology [phone]) is advised. The SARS-CoV-2 RNA is generally  detectable in upper and lower respiratory sp ecimens during the acute  phase of infection. The expected result is Negative. Fact Sheet for Patients:  StrictlyIdeas.no Fact Sheet for Healthcare Providers: BankingDealers.co.za This test is not yet approved or cleared by the [place] FDA and has been authorized for detection and/or diagnosis of SARS-CoV-2 by FDA under an Emergency Use Authorization (EUA).  This EUA will remain in effect (meaning this test can be used) for the duration of the COVID-19 declaration under Section 564(b)(1) of the Act, 21 U.S.C. section 360bbb-3(b)(1), unless the authorization is terminated or revoked sooner. Performed at [facility], [place] [address], [place], [place] [address]      Labs: Basic Metabolic Panel: Recent Labs  Lab 09/18/18 0302 09/20/18 0558 09/22/18 0215  NA 136 134* 137  K 4.2 4.0 3.1*  CL 105 104 106  CO2 22 21* 22  GLUCOSE 92 105* 113*  BUN <5* <5* 6  CREATININE 0.59 0.55 0.54  CALCIUM 8.5* 8.8* 8.5*   Liver Function Tests: Recent Labs  Lab 09/18/18 0302 09/20/18 0558  AST 34 26  ALT 13 13  ALKPHOS 122 136*  BILITOT 2.7* 3.1*  PROT 6.3* 6.6  ALBUMIN 3.7 3.7   No results for input(s): LIPASE, AMYLASE in the last 168 hours. No results  for input(s): AMMONIA in the last 168 hours. CBC: Recent Labs  Lab 09/18/18 0302 09/20/18 0558 09/22/18 0215 09/23/18 0918 09/24/18 0901  WBC 21.0* 20.2* 18.0* 12.3* 9.8  NEUTROABS 12.0* 13.1* 10.9* 5.0  --   HGB 7.5* 8.1* 8.5* 7.3* 7.5*  HCT 21.5* 23.1* 24.7* 20.7* 21.0*  MCV 93.5 95.1 95.7 94.5 93.3  PLT 520* 521* 422* 355 358   Cardiac Enzymes: No results for input(s): CKTOTAL, CKMB, CKMBINDEX, TROPONINI in the last 168 hours. BNP: Invalid input(s): POCBNP CBG: No results for input(s): GLUCAP in the last 168 hours.  Time coordinating discharge: 50 minutes  Signed:  [facility] 09/24/2018, 12:01 PM

## 2018-09-24 NOTE — Discharge Instructions (Signed)
Information on my medicine - XARELTO (rivaroxaban)  This medication education was reviewed with me or my healthcare representative as part of my discharge preparation.  The pharmacist that spoke with me during my hospital stay was:  Earlie RavelingAllie  King, Student-PharmD  WHY WAS XARELTO PRESCRIBED FOR YOU? 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  What do you need to know about Xarelto? The starting dose is one 15 mg tablet taken TWICE daily with food for the FIRST 21 DAYS then on (enter date)  10/14/2018  Pulmonary Embolism  A pulmonary embolism (PE) is a sudden blockage or decrease of blood flow in one lung or both lungs. Most blockages come from a blood clot that forms in a lower leg, thigh, or arm vein (deep vein thrombosis, DVT) and travels to the lungs. A clot is blood that has thickened into a gel or solid. PE is a dangerous and life-threatening condition that needs to be treated right away. What are the causes? This condition is usually caused by a blood clot that forms in a vein and moves to the lungs. In rare cases, it may be caused by air, fat, part of a tumor, or other tissue that moves through the veins and into the lungs. What increases the risk? The following factors may make you more likely to develop this condition:  Traumatic injury, such as breaking a hip or leg.  Spinal cord injury.  Orthopedic surgery, especially hip or knee replacement.  Any major surgery.  Stroke.  Having DVT.  Blood clots or blood clotting disease.  Long-term (chronic) lung or heart disease.  Taking medicines that contain estrogen. These include birth control pills and hormone replacement therapy.  Cancer and chemotherapy.  Having a central venous catheter.  Pregnancy and the period of time after delivery (postpartum).  Being  older than age 160.  Being overweight.  Smoking. What are the signs or symptoms? Symptoms of this condition usually start suddenly and include:  Shortness of breath during activity or at rest.  Coughing or coughing up blood or blood-tinged mucus.  Chest pain that is often worse with deep breaths.  Rapid or irregular heartbeat.  Feeling light-headed or dizzy.  Fainting.  Feeling anxious.  Fever.  Sweating.  Pain and swelling in a leg. This is a symptom of DVT, which can lead to PE. How is this diagnosed? This condition may be diagnosed based on:  Your medical history.  A physical exam.  Blood tests.  CT pulmonary angiogram. This test checks blood flow in and around your lungs.  Ventilation-perfusion scan, also called a lung VQ scan. This test measures air flow and blood flow to the lungs.  Ultrasound of the legs. How is this treated? Treatment for this condition depends on many factors, such as the cause of your PE, your risk for bleeding or developing more clots, and other medical conditions you have. Treatment aims to remove, dissolve, or stop blood clots from forming or growing larger. Treatment may include:  Medicines, such as: ? Blood thinning medicines (anticoagulants) to stop clots from forming or growing. ? Medicines that dissolve clots (thrombolytics).  Procedures, such as: ? Using a flexible tube to remove a blood clot (embolectomy) or deliver medicine to destroy it (catheter-directed thrombolysis). ? Inserting a filter into a large vein that carries blood to the heart (  inferior vena cava). This filter (vena cava filter) catches blood clots before they reach the lungs. ? Surgery to remove the clot (surgical embolectomy). This is rare. You may need a combination of immediate, long-term (up to 3 months after diagnosis), and extended (more than 3 months after diagnosis) treatments. Your treatment may continue for several months (maintenance therapy). You and  your health care provider will work together to choose the treatment program that is best for you. Follow these instructions at home: Medicines  Take over-the-counter and prescription medicines only as told by your health care provider.  If you are taking an anticoagulant medicine: ? Take the medicine every day at the same time each day. ? Understand what foods and drugs interact with your medicine. ? Understand the side effects of this medicine, including excessive bruising or bleeding. Ask your health care provider or pharmacist about other side effects. General instructions  Wear a medical alert bracelet or carry a medical alert card that says you have had a PE and lists what medicines you take.  Ask your health care provider when you may return to your normal activities. Avoid sitting or lying for a long time without moving.  Maintain a healthy weight. Ask your health care provider what weight is healthy for you.  Do not use any products that contain nicotine or tobacco, such as cigarettes and e-cigarettes. If you need help quitting, ask your health care provider.  Talk with your health care provider about any travel plans. It is important to make sure that you are still able to take your medicine while on trips.  Keep all follow-up visits as told by your health care provider. This is important. Contact a health care provider if:  You missed a dose of your blood thinner medicine. Get help right away if:  You have: ? New or increased pain, swelling, warmth, or redness in an arm or leg. ? Numbness or tingling in an arm or leg. ? Shortness of breath during activity or at rest. ? A fever. ? Chest pain. ? A rapid or irregular heartbeat. ? A severe headache. ? Vision changes. ? A serious fall or accident, or you hit your head. ? Stomach (abdominal) pain. ? Blood in your vomit, stool, or urine. ? A cut that will not stop bleeding.  You cough up blood.  You feel light-headed  or dizzy.  You cannot move your arms or legs.  You are confused or have memory loss. These symptoms may represent a serious problem that is an emergency. Do not wait to see if the symptoms will go away. Get medical help right away. Call your local emergency services (911 in the [place] Do not drive yourself to the hospital. Summary  A pulmonary embolism (PE) is a sudden blockage or decrease of blood flow in one lung or both lungs. PE is a dangerous and life-threatening condition that needs to be treated right away.  Treatments for this condition usually include medicines to thin your blood (anticoagulants) or medicines to break apart blood clots (thrombolytics).  If you are given blood thinners, it is important to take the medicine every single day at the same time each day.  If you have signs of PE or DVT, call your local emergency services (911 in the [place] This information is not intended to replace advice given to you by your health care provider. Make sure you discuss any questions you have with your health care provider. Document Released: 03/28/2000 Document Revised: 11/13/2017 Document  Reviewed: 05/14/2017 Elsevier Interactive Patient Education  2019 [employer]  Sickle Cell Anemia, Adult  Sickle cell anemia is a condition in which red blood cells have an abnormal sickle shape. Red blood cells carry oxygen through the body. Sickle-shaped red blood cells do not live as long as normal red blood cells. They also clump together and block blood from flowing through the blood vessels. This condition prevents the body from getting enough oxygen. Sickle cell anemia causes organ damage and pain. It also increases the risk of infection. What are the causes? This condition is caused by a gene that is passed from parent to child (inherited). Receiving two copies of the gene causes the disease. Receiving one copy causes the "trait," which means that symptoms are milder or not present. What  increases the risk? This condition is more likely to develop if your ancestors were from [place], [place], [place], the [place], [place], or the [place]. What are the signs or symptoms? Symptoms of this condition include:  Episodes of pain (crises), especially in the hands and feet, joints, back, chest, or abdomen. The pain can be triggered by: ? An illness, especially if there is dehydration. ? Doing an activity with great effort (overexertion). ? Exposure to extreme temperature changes. ? High altitude.  Fatigue.  Shortness of breath or difficulty breathing.  Dizziness.  Pale skin or yellowed skin (jaundice).  Frequent bacterial infections.  Pain and swelling in the hands and feet (hand-food syndrome).  Prolonged, painful erection of the penis (priapism).  Acute chest syndrome. Symptoms of this include: ? Chest pain. ? Fever. ? Cough. ? Fast breathing.  Stroke.  Decreased activity.  Loss of appetite.  Change in behavior.  Headaches.  Seizures.  Vision changes.  Skin ulcers.  Heart disease.  High blood pressure.  Gallstones.  Liver and kidney problems. How is this diagnosed? This condition is diagnosed with blood tests that check for the gene that causes this condition. How is this treated? There is no cure for most cases of this condition. Treatment focuses on managing your symptoms and preventing complications of the disease. Your health care provider will work with you to identify the best treatment options for you based on an assessment of your condition. Treatment may include:  Medicines, including: ? Pain medicines. ? Antibiotic medicines for infection. ? Medicines to increase the production of a protein in red blood cells that helps carry oxygen in the body (hemoglobin).  Fluids to treat pain and swelling.  Oxygen to treat acute chest syndrome.  Blood transfusions to treat symptoms such as fatigue, stroke, and  acute chest syndrome.  Massage and physical therapy for pain.  Regular tests to monitor your condition, such as blood tests, X-rays, CT scans, MRI scans, ultrasounds, and lung function tests. These should be done every 3-12 months, depending on your age.  Hematopoietic stem cell transplant. This is a procedure to replace abnormal stem cells with healthy stem cells from a donor's bone marrow. Stem cells are cells that can develop into blood cells, and bone marrow is the spongy tissue inside the bones. Follow these instructions at home: Medicines  Take over-the-counter and prescription medicines only as told by your health care provider.  If you were prescribed an antibiotic medicine, take it as told by your health care provider. Do not stop taking the antibiotic even if you start to feel better.  If you develop a fever, do not take medicines to reduce the fever right away.  This could cover up another problem. Notify your health care provider. Managing pain, stiffness, and swelling  Try these methods to help ease your pain: ? Using a heating pad. ? Taking a warm bath. ? Distracting yourself, such as by watching TV. Eating and drinking  Drink enough fluid to keep your urine clear or pale yellow. Drink more in hot weather and during exercise.  Limit or avoid drinking alcohol.  Eat a balanced and nutritious diet. Eat plenty of fruits, vegetables, whole grains, and lean protein.  Take vitamins and supplements as directed by your health care provider. Traveling  When traveling, keep these with you: ? Your medical information. ? The names of your health care providers. ? Your medicines.  If you have to travel by air, ask about precautions you should take. Activity  Get plenty of rest.  Avoid activities that will lower your oxygen levels, such as exercising vigorously. General instructions  Do not use any products that contain nicotine or tobacco, such as cigarettes and  e-cigarettes. They lower blood oxygen levels. If you need help quitting, ask your health care provider.  Consider wearing a medical alert bracelet.  Avoid high altitudes.  Avoid extreme temperatures and extreme temperature changes.  Keep all follow-up visits as told by your health care provider. This is important. Contact a health care provider if:  You develop joint pain.  Your feet or hands swell or have pain.  You have fatigue. Get help right away if:  You have symptoms of infection. These include: ? Fever. ? Chills. ? Extreme tiredness. ? Irritability. ? Poor eating. ? Vomiting.  You feel dizzy or faint.  You have new abdominal pain, especially on the left side near the stomach area.  You develop priapism.  You have numbness in your arms or legs or have trouble moving them.  You have trouble talking.  You develop pain that cannot be controlled with medicine.  You become short of breath.  You have rapid breathing.  You have a persistent cough.  You have pain in your chest.  You develop a severe headache or stiff neck.  You feel bloated without eating or after eating a small amount of food.  Your skin is pale.  You suddenly lose vision. Summary  Sickle cell anemia is a condition in which red blood cells have an abnormal sickle shape. This disease can cause organ damage and chronic pain, and it can raise your risk of infection.  Sickle cell anemia is a genetic disorder.  Treatment focuses on managing your symptoms and preventing complications of the disease.  Get medical help right away if you have any signs of infection, such as a fever. This information is not intended to replace advice given to you by your health care provider. Make sure you discuss any questions you have with your health care provider. Document Released: 07/09/2005 Document Revised: 05/06/2016 Document Reviewed: 05/06/2016 Elsevier Interactive Patient Education  2019 [employer].   the dose is changed to one 20 mg tablet taken ONCE A DAY with your evening meal.  DO NOT stop taking Xarelto without talking to the health care provider who prescribed the medication.  Refill your prescription for 20 mg tablets before you run out.  After discharge, you should have regular check-up appointments with your healthcare provider that is prescribing your Xarelto.  In the future your dose may need to be changed if your kidney function changes by a significant amount.  What do you do if you  miss a dose? If you are taking Xarelto TWICE DAILY and you miss a dose, take it as soon as you remember. You may take two 15 mg tablets (total 30 mg) at the same time then resume your regularly scheduled 15 mg twice daily the next day.  If you are taking Xarelto ONCE DAILY and you miss a dose, take it as soon as you remember on the same day then continue your regularly scheduled once daily regimen the next day. Do not take two doses of Xarelto at the same time.   Important Safety Information Xarelto is a blood thinner medicine that can cause bleeding. You should call your healthcare provider right away if you experience any of the following: ? Bleeding from an injury or your nose that does not stop. ? Unusual colored urine (red or dark brown) or unusual colored stools (red or black). ? Unusual bruising for unknown reasons. ? A serious fall or if you hit your head (even if there is no bleeding).  Some medicines may interact with Xarelto and might increase your risk of bleeding while on Xarelto. To help avoid this, consult your healthcare provider or pharmacist prior to using any new prescription or non-prescription medications, including herbals, vitamins, non-steroidal anti-inflammatory drugs (NSAIDs) and supplements.  This website has more information on Xarelto: VisitDestination.com.br.

## 2018-10-03 ENCOUNTER — Emergency Department (HOSPITAL_COMMUNITY)
Admission: EM | Admit: 2018-10-03 | Discharge: 2018-10-03 | Discharge status: Against Medical Advice | Payer: Medicaid Other | Attending: Emergency Medicine | Admitting: Emergency Medicine

## 2018-10-03 ENCOUNTER — Other Ambulatory Visit: Payer: Self-pay

## 2018-10-03 DIAGNOSIS — M79606 Pain in leg, unspecified: Secondary | ICD-10-CM | POA: Diagnosis present

## 2018-10-03 DIAGNOSIS — D5700 Hb-SS disease with crisis, unspecified: Secondary | ICD-10-CM

## 2018-10-03 DIAGNOSIS — J45909 Unspecified asthma, uncomplicated: Secondary | ICD-10-CM | POA: Diagnosis not present

## 2018-10-03 DIAGNOSIS — D57219 Sickle-cell/Hb-C disease with crisis, unspecified: Secondary | ICD-10-CM | POA: Insufficient documentation

## 2018-10-03 DIAGNOSIS — Z7901 Long term (current) use of anticoagulants: Secondary | ICD-10-CM | POA: Insufficient documentation

## 2018-10-03 DIAGNOSIS — F17200 Nicotine dependence, unspecified, uncomplicated: Secondary | ICD-10-CM | POA: Diagnosis not present

## 2018-10-03 DIAGNOSIS — Z5329 Procedure and treatment not carried out because of patient's decision for other reasons: Secondary | ICD-10-CM

## 2018-10-03 LAB — CBC WITH DIFFERENTIAL/PLATELET
Abs Immature Granulocytes: 0.2 10*3/uL — ABNORMAL HIGH (ref 0.00–0.07)
Basophils Absolute: 0 10*3/uL (ref 0.0–0.1)
Basophils Relative: 0 %
Eosinophils Absolute: 0.5 10*3/uL (ref 0.0–0.5)
Eosinophils Relative: 2 %
HCT: 21.7 % — ABNORMAL LOW (ref 36.0–46.0)
Hemoglobin: 7.5 g/dL — ABNORMAL LOW (ref 12.0–15.0)
Immature Granulocytes: 1 %
Lymphocytes Relative: 23 %
Lymphs Abs: 4 10*3/uL (ref 0.7–4.0)
MCH: 32.8 pg (ref 26.0–34.0)
MCHC: 34.6 g/dL (ref 30.0–36.0)
MCV: 94.8 fL (ref 80.0–100.0)
Monocytes Absolute: 1.5 10*3/uL — ABNORMAL HIGH (ref 0.1–1.0)
Monocytes Relative: 9 %
Neutro Abs: 11.5 10*3/uL — ABNORMAL HIGH (ref 1.7–7.7)
Neutrophils Relative %: 65 %
Platelets: 526 10*3/uL — ABNORMAL HIGH (ref 150–400)
RBC: 2.29 MIL/uL — ABNORMAL LOW (ref 3.87–5.11)
RDW: 25.3 % — ABNORMAL HIGH (ref 11.5–15.5)
WBC: 17.5 10*3/uL — ABNORMAL HIGH (ref 4.0–10.5)
nRBC: 8.8 % — ABNORMAL HIGH (ref 0.0–0.2)

## 2018-10-03 LAB — COMPREHENSIVE METABOLIC PANEL
ALT: 14 U/L (ref 0–44)
AST: 25 U/L (ref 15–41)
Albumin: 3.6 g/dL (ref 3.5–5.0)
Alkaline Phosphatase: 117 U/L (ref 38–126)
Anion gap: 7 (ref 5–15)
BUN: <5 mg/dL — ABNORMAL LOW (ref 6–20)
CO2: 24 mmol/L (ref 22–32)
Calcium: 8.3 mg/dL — ABNORMAL LOW (ref 8.9–10.3)
Chloride: 103 mmol/L (ref 98–111)
Creatinine, Ser: 0.57 mg/dL (ref 0.44–1.00)
GFR calc Af Amer: >60 mL/min (ref >60)
GFR calc non Af Amer: >60 mL/min (ref >60)
Glucose, Bld: 97 mg/dL (ref 70–99)
Potassium: 3.7 mmol/L (ref 3.5–5.1)
Sodium: 134 mmol/L — ABNORMAL LOW (ref 135–145)
Total Bilirubin: 2.2 mg/dL — ABNORMAL HIGH (ref 0.3–1.2)
Total Protein: 6 g/dL — ABNORMAL LOW (ref 6.5–8.1)

## 2018-10-03 LAB — RETICULOCYTES
Immature Retic Fract: 32.2 % — ABNORMAL HIGH (ref 2.3–15.9)
RBC.: 2.29 MIL/uL — ABNORMAL LOW (ref 3.87–5.11)
Retic Count, Absolute: 933.5 10*3/uL — ABNORMAL HIGH (ref 19.0–186.0)
Retic Ct Pct: >30 % — ABNORMAL HIGH (ref 0.4–3.1)

## 2018-10-03 LAB — I-STAT BETA HCG BLOOD, ED (MC, WL, AP ONLY): I-stat hCG, quantitative: <5 m[IU]/mL (ref <5)

## 2018-10-03 MED ORDER — DIPHENHYDRAMINE HCL 50 MG/ML IJ SOLN: 25.0000 mg | Freq: Once | INTRAMUSCULAR | Status: DC

## 2018-10-03 MED ORDER — HYDROMORPHONE HCL 1 MG/ML IJ SOLN: 1.0000 mg | Freq: Once | INTRAMUSCULAR | Status: DC

## 2018-10-03 NOTE — ED Provider Notes (Signed)
[facility] DEPARTMENT Provider Note   CSN: [id number] Arrival date & time: 10/03/18  0411    History   Chief Complaint Chief Complaint  Patient presents with   Leg Pain   Arm Pain    HPI Joanna [name] is a 38 y.o. female with a past medical history of sickle cell anemia, PE anticoagulated with Xarelto, who presents today for evaluation of pain in her bilateral legs.  She reports that her legs have been hurting since yesterday.  She states that this is consistent with her usual sickle cell pain crises and is not concerning to her.  She tells me that she knows her sickle cell pain flares and that is what this is.  States that she has been taking her home pain medicines without significant relief.  She denies any fevers, cough, or shortness of breath.  She reports that she is currently visiting [place].  This is her seventh visit and the [facility] system in the past month.  Review and care everywhere shows that she has chronic bilateral lower extremity pain and tenderness to palpation.  As noted in discharge summary by Dr. Rosiland Oz in care everywhere on 03/13/2018 she also has reportedly had a 6-minute treadmill test at [facility] showing that she needs home oxygen.  She also has a reported history of narcotic abuse and dependence.  The note also states that she had a letter from [facility] in 2016 suggesting a single dose of oxycodone 15 mg and not to give any IV, IM or subcu injections or opioids.  She also reportedly has a history of seeking Dilaudid and IV Benadryl.     HPI  Past Medical History:  Diagnosis Date   Asthma    Sickle cell anemia ([place])     Patient Active Problem List   Diagnosis Date Noted   Pulmonary embolism ([place]) 09/22/2018   Chronic pain syndrome 09/22/2018   Leukocytosis 09/22/2018   Hypoxia    Community acquired pneumonia    Sickle cell crisis ([place]) 09/28/2017   Sickle cell anemia with pain ([place]) 09/28/2017   Sickle cell pain crisis  ([place]) 09/21/2017   Abdominal pain 09/21/2017   Tobacco abuse 09/21/2017    No past surgical history on file.   OB History    Gravida  1   Para      Term      Preterm      AB      Living        SAB      TAB      Ectopic      Multiple      Live Births               Home Medications    Prior to Admission medications   Medication Sig Start Date End Date Taking? Authorizing Provider  albuterol (PROVENTIL HFA) 108 (90 Base) MCG/ACT inhaler Inhale 2 puffs into the lungs every 6 (six) hours as needed for wheezing.  12/20/13   [provider]  DULoxetine (CYMBALTA) 30 MG capsule Take 30-60 mg by mouth See admin instructions. Take 2 capsules (60mg ) in the morning and 1 capsule (30mg ) in the evening. 09/04/17   [provider]  fluticasone (FLONASE) 50 MCG/ACT nasal spray Place 1 spray into the nose daily as needed for allergies.     [provider]  folic acid (FOLVITE) 1 MG tablet Take 1 mg by mouth daily.    [provider]  gabapentin (NEURONTIN)  100 MG capsule Take 100 mg by mouth 3 (three) times daily. 08/17/17   [provider]  hydroxyurea (HYDREA) 100 mg/mL SUSP Take 1,000 mg by mouth daily.     [provider]  methadone (DOLOPHINE) 5 MG tablet Take 5 mg by mouth every 12 (twelve) hours.    [provider]  naloxone [facility](NARCAN) nasal spray 4 mg/0.1 mL Place 1 spray into the nose once as needed (opiod overdose).  11/21/14   [provider]  Oxycodone HCl 10 MG TABS Take 10 mg by mouth every 4 (four) hours as needed (pain).     [provider]  promethazine (PHENERGAN) 25 MG tablet Take 1 tablet (25 mg total) by mouth every 6 (six) hours as needed for nausea or vomiting. 11/26/17   Muthersbaugh, Dahlia ClientHannah, PA-C  risperiDONE (RISPERDAL) 2 MG tablet Take 2 mg by mouth 2 (two) times daily. 10/03/17   [provider]  Rivaroxaban (XARELTO) 15 MG TABS tablet Take 1 tablet (15 mg total) by mouth 2  (two) times daily. 09/24/18   Quentin AngstJegede, Olugbemiga E, MD  rivaroxaban (XARELTO) 20 MG TABS tablet Take 1 tablet (20 mg total) by mouth daily with supper. 10/14/18   Quentin AngstJegede, Olugbemiga E, MD    Family History Family History  Problem Relation Age of Onset   Diabetes Father     Social History Social History   Tobacco Use   Smoking status: Current Every Day Smoker    Packs/day: 1.00   Smokeless tobacco: Current User  Substance Use Topics   Alcohol use: Not Currently    Comment: "every blue moon"   Drug use: Not Currently     Allergies   Ondansetron and Metoclopramide   Review of Systems Review of Systems  Constitutional: Negative for chills and fever.  HENT: Negative for congestion.   Eyes: Negative for visual disturbance.  Respiratory: Negative for chest tightness and shortness of breath.   Cardiovascular: Negative for chest pain. Leg swelling: Chronic, unchanged.  Gastrointestinal: Negative for abdominal pain, diarrhea, nausea and vomiting.  Genitourinary: Negative for dysuria.  Musculoskeletal: Negative for back pain and neck pain.       Pain in bilateral lower extremities.    Skin: Negative for color change and wound.  Neurological: Negative for weakness and headaches.  Psychiatric/Behavioral: Negative for confusion.  All other systems reviewed and are negative.    Physical Exam Updated Vital Signs BP 113/70    Pulse 89    Temp 98.2 F (36.8 C) (Oral)    Resp 18    Ht 4\' 11"  (1.499 m)    Wt 63.5 kg    LMP 09/22/2018 Comment: negative beta HCG 09/22/18   SpO2 92%    BMI 28.28 kg/m   Physical Exam Vitals signs and nursing note reviewed.  Constitutional:      General: She is not in acute distress.    Appearance: She is well-developed. She is not diaphoretic.  HENT:     Head: Normocephalic and atraumatic.  Eyes:     General: No scleral icterus.       Right eye: No discharge.        Left eye: No discharge.     Conjunctiva/sclera: Conjunctivae normal.  Neck:       Musculoskeletal: Normal range of motion.  Cardiovascular:     Rate and Rhythm: Normal rate and regular rhythm.  Pulmonary:     Effort: Pulmonary effort is normal. No respiratory distress.     Breath sounds: No  stridor.  Abdominal:     General: There is no distension.  Musculoskeletal:        General: No deformity.     Right lower leg: Edema present.     Left lower leg: Edema present.     Comments: Diffuse tenderness to palpation over bilateral lower extremities which are slightly swollen.  Compartments are soft and easily compressible without crepitus or deformity.  Skin:    General: Skin is warm and dry.  Neurological:     General: No focal deficit present.     Mental Status: She is alert and oriented to person, place, and time.     Motor: No abnormal muscle tone.  Psychiatric:        Mood and Affect: Mood normal.        Behavior: Behavior normal.      ED Treatments / Results  Labs (all labs ordered are listed, but only abnormal results are displayed) Labs Reviewed  CBC WITH DIFFERENTIAL/PLATELET - Abnormal; Notable for the following components:      Result Value   Platelets 526 (*)    All other components within normal limits  COMPREHENSIVE METABOLIC PANEL  RETICULOCYTES  I-STAT BETA HCG BLOOD, ED ([facility], [facility], [facility] ONLY)    EKG None  Radiology No results found.  Procedures Procedures (including critical care time)  Medications Ordered in ED Medications - No data to display   Initial Impression / Assessment and Plan / ED Course  I have reviewed the triage vital signs and the nursing notes.  Pertinent labs & imaging results that were available during my care of the patient were reviewed by me and considered in my medical decision making (see chart for details).  Clinical Course as of Oct 03 542  Sun Oct 03, 2018  [id number] I was informed that patient was refusing EKG and cardiac monitoring and requested to speak with me.  I went to talk with her and informed her  that previously her QTC was prolonged and therefore I would not feel safe giving her Benadryl without a repeat.  She then asked for her Dilaudid without the Benadryl stating that she can tolerated okay.  I discussed that, based on chart review showing care plans at outside facility was going to start with a dose of oral pain medicine.  She immediately began removing pulse ox, blood pressure cuff stating that she wants to leave.  In chart review she has extensive history documented at outside facilities of seeking Dilaudid.  We discussed that her leaving would be AGAINST MEDICAL ADVICE as I do not yet have her results and that she may have a serious, life threatening condition.  She states her understanding.  She stood up with out difficulty and was ambulatory in room with out difficulty. Or apparent pain.    [EH]    Clinical Course User Index [EH] Cristina GongHammond, Adelfa Lozito W, PA-C      Patient presents today for evaluation of a reported sickle cell pain crisis.  She reports pain in her bilateral lower extremities and tells me that is consistent with her usual sickle cell related pain.  She states that she is not concerned for any other etiology of her symptoms.  She reports compliance with her anticoagulation.  She was noted to be hypoxic borderline on arrival.  Extensive chart review of records from the [facility] along with those from other hospitals and care everywhere shows that she has documented chronic pain and swelling of her bilateral legs along  with history of care plans stating that she seeks Dilaudid and recommending giving her a dose of p.o. pain medicine and no parenteral injections or narcotics.  She also reportedly has had a treadmill test showing that she is supposed to be on oxygen full-time, and her noncompliance with oxygen I suspect directly contributes to her hypoxia.    He was recently admitted for PE however she does not have any chest pain, shortness of breath and reports compliance with  her Xarelto.  Patient's port was accessed and labs were obtained.  Chart review shows that when she was recently admitted she had a borderline prolonged QT interval.  As she requested IV Benadryl with any narcotic pain medicine order placed for EKG to evaluate for continued QT prolongation.  Patient refused this and asked to speak with me.  I explained the purpose of this along with plan to treat her with a dose of p.o. pain medicine and reevaluate her before considering progression/escalation to parenteral narcotic medicines.  At this point patient stated that she wished to leave and begin to remove blood pressure cuff and get dressed.  We discussed the risks of this including that her labs have not resulted and that she may have a possibly serious or life-threatening condition.  She states her understanding of these risks and still wishes to leave.  I informed patient that she would be signing out AGAINST MEDICAL ADVICE and attempted to convince her to stay until her evaluation was complete.  She did not have any family members present for me to ask to help persuade her to stay.  She is aware that she has left she may have abnormal labs and may not be notified.  According to RN after I left the room patient de-accessed her own port and walked out of the department.  While I saw her ambulating in the room she was in no obvious distress with no difficulties walking.  Patient left the department with out difficulty.    Final Clinical Impressions(s) / ED Diagnoses   Final diagnoses:  Left against medical advice  Sickle cell pain crisis [facility](HCC)    ED Discharge Orders    None       Norman ClayHammond, Lukka Black W, PA-C 10/03/18 0556    Zadie RhineWickline, Donald, MD 10/03/18 [phone]

## 2018-10-03 NOTE — ED Triage Notes (Addendum)
Pt POV d/t BUE & BLE throbbing pain that started x2 days ago

## 2018-10-03 NOTE — Discharge Instructions (Signed)
Today you chose to sign out against medical advice.  You were told that you have not been fully evaluated (as your labs have not yet resulted) and that you may have a serious or life threatening condition.

## 2018-10-03 NOTE — ED Notes (Signed)
Pt refused to allow RN to deaccess her port. Pt ripped out Port and left against medical advice

## 2018-10-03 NOTE — ED Notes (Signed)
Pt Refused RN to obtain EKG and place her Cardiac Monitoring. RN informed PA

## 2018-12-15 ENCOUNTER — Other Ambulatory Visit: Payer: Self-pay | Admitting: Internal Medicine

## 2018-12-16 ENCOUNTER — Other Ambulatory Visit: Payer: Self-pay | Admitting: Internal Medicine

## 2020-03-20 IMAGING — CT CT ANGIOGRAPHY CHEST
2 of 9 series · 18 of 46 positions shown · IV contrast (omnipaque)
Comparison: Chest radiograph from earlier today.

CLINICAL DATA: Sickle cell anemia. Hypoxia.

EXAM:
CT ANGIOGRAPHY CHEST WITH CONTRAST
TECHNIQUE: Multidetector CT imaging of the chest was performed using the
standard protocol during bolus administration of intravenous
contrast. Multiplanar CT image reconstructions and MIPs were
obtained to evaluate the vascular anatomy.
CONTRAST:  52mL OMNIPAQUE IOHEXOL 350 MG/ML SOLN

[Series 7: thins · axial · 0.62mm/px · z∈[+1123,+1375]mm · 15 of 278 slices shown]
[im 13/278  lung]
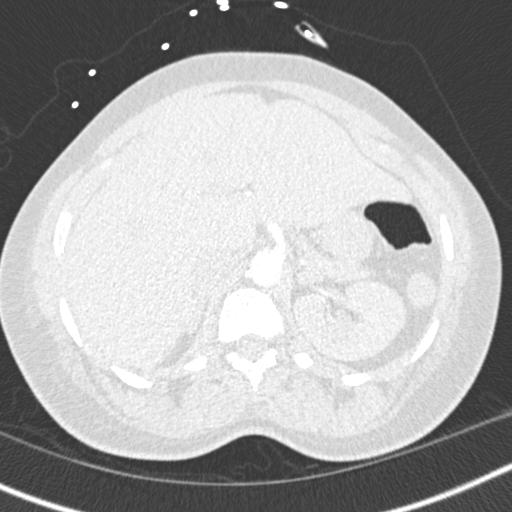
[im 38/278  soft-tissue]
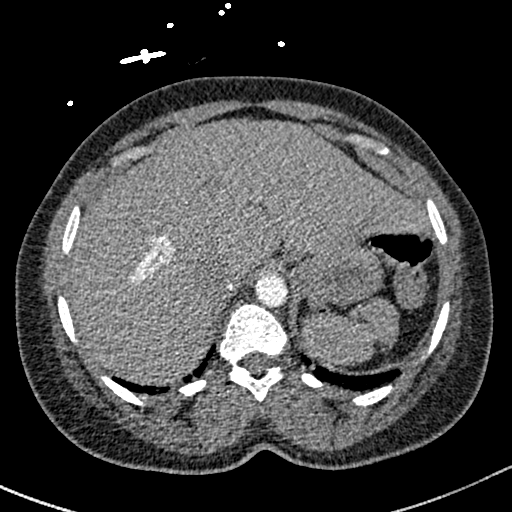
[im 51/278  lung]
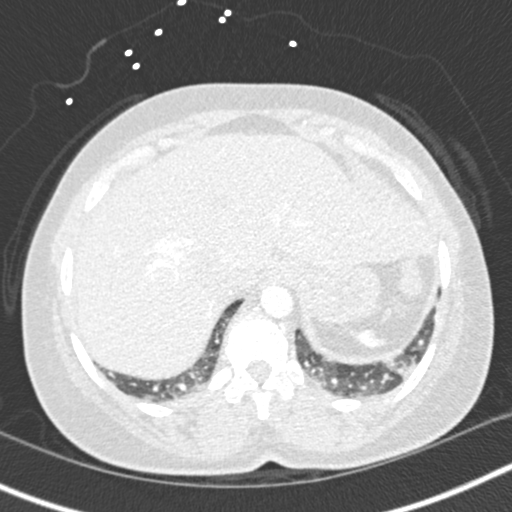
[im 63/278  soft-tissue]
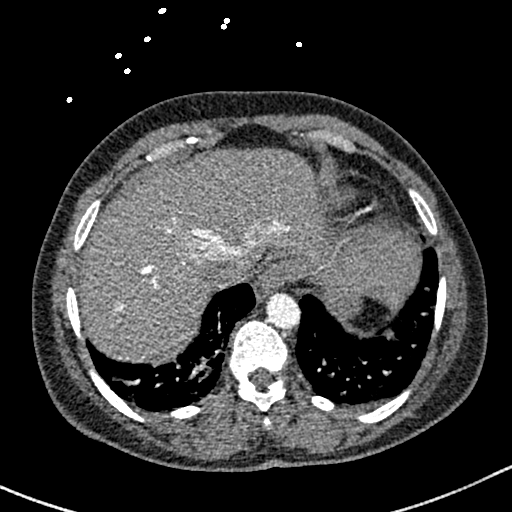
[im 89/278  lung]
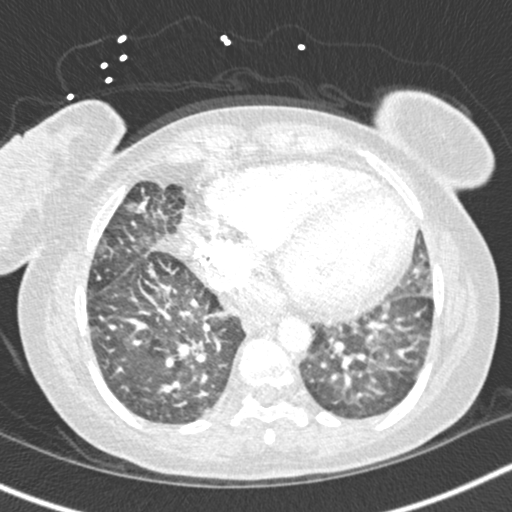
[im 101/278  soft-tissue]
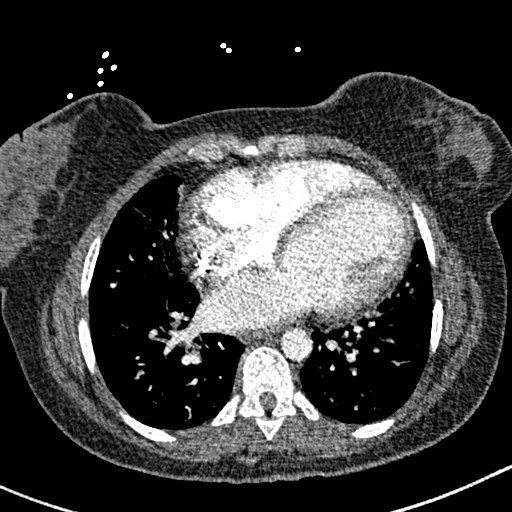
[im 126/278  lung]
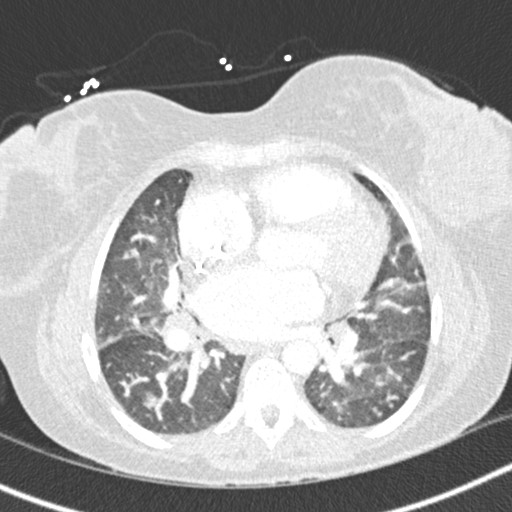
[im 139/278  soft-tissue]
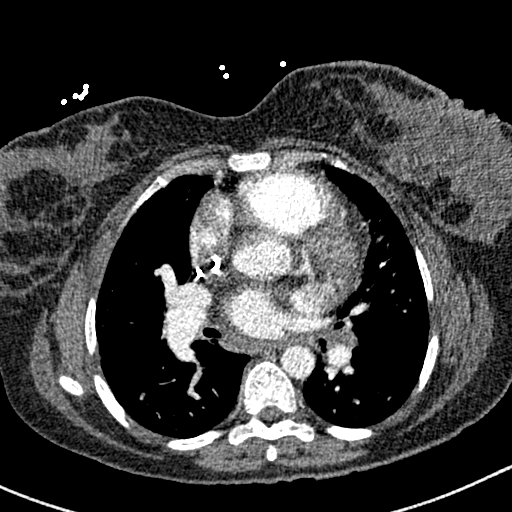
[im 152/278  lung]
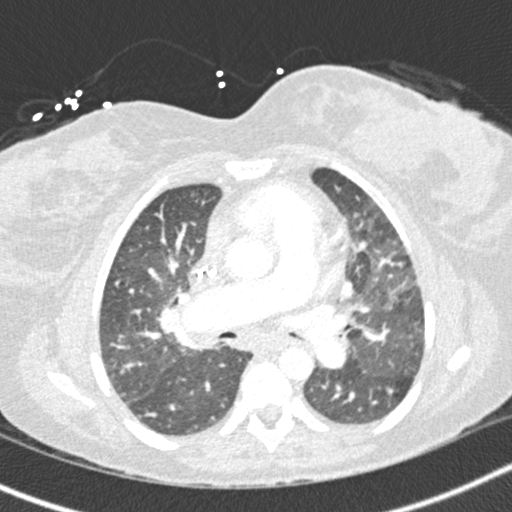
[im 177/278  soft-tissue]
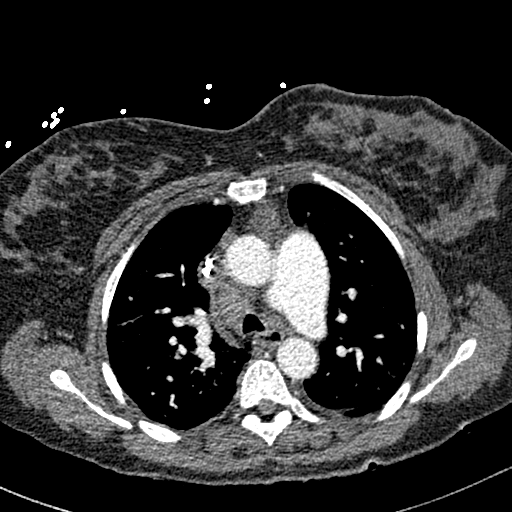
[im 189/278  lung]
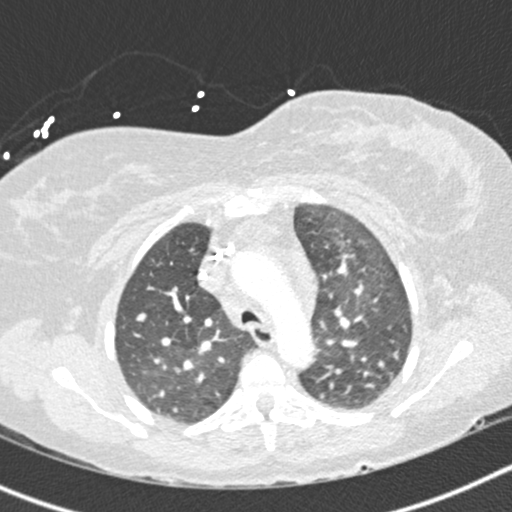
[im 215/278  soft-tissue]
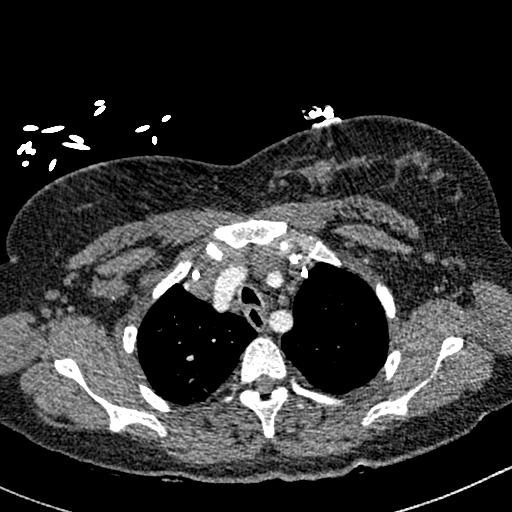
[im 227/278  lung]
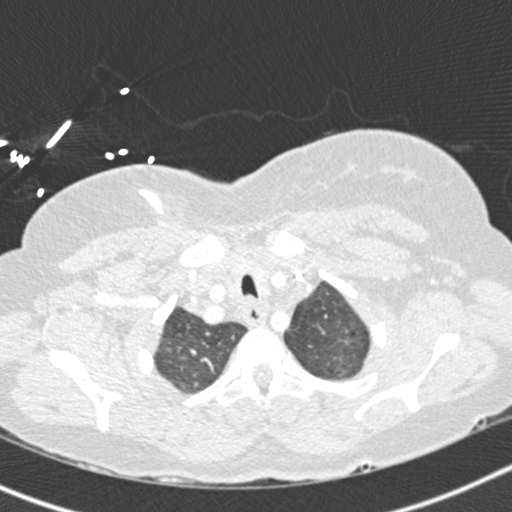
[im 240/278  soft-tissue]
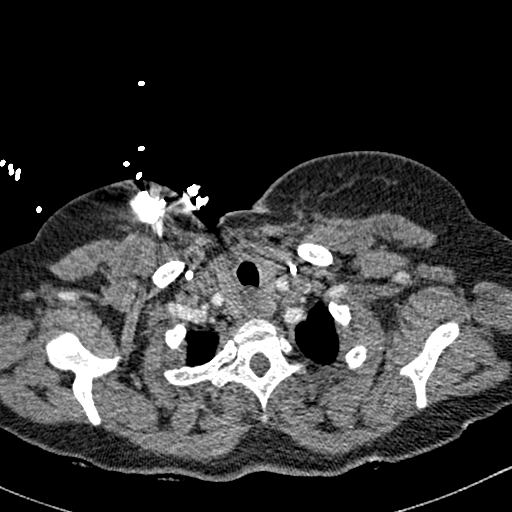
[im 265/278  lung]
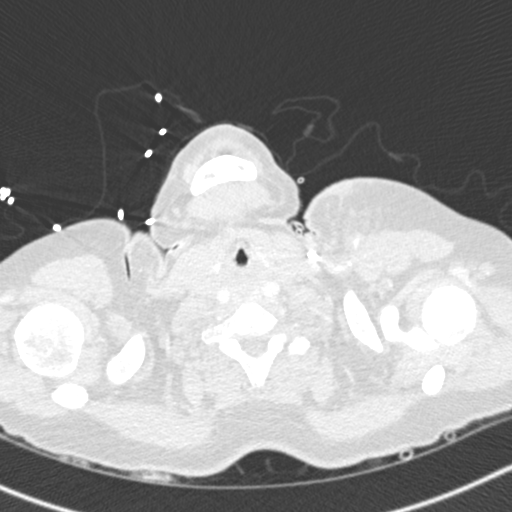

[Series 9: coronal mpr · coronal · 0.55mm/px · 3 of 151 slices shown]
[im 38/151  soft-tissue]
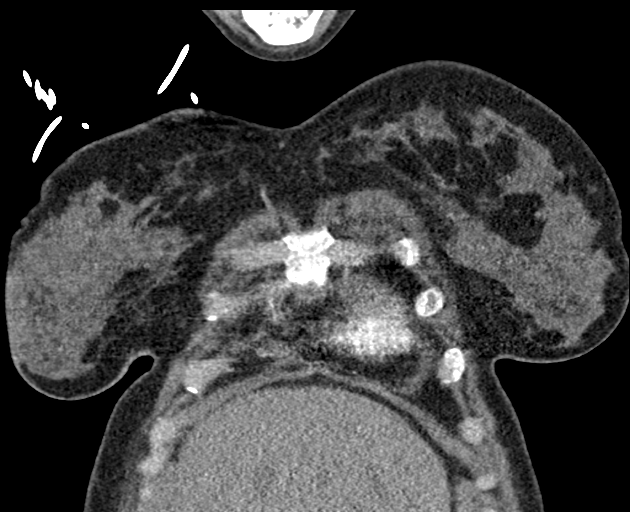
[im 76/151  soft-tissue]
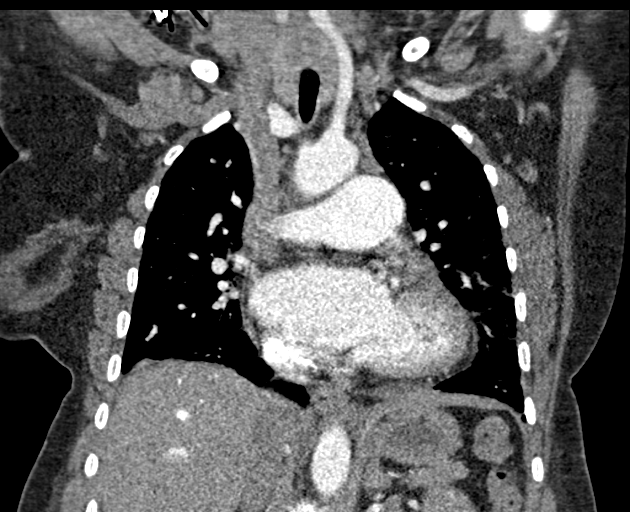
[im 113/151  soft-tissue]
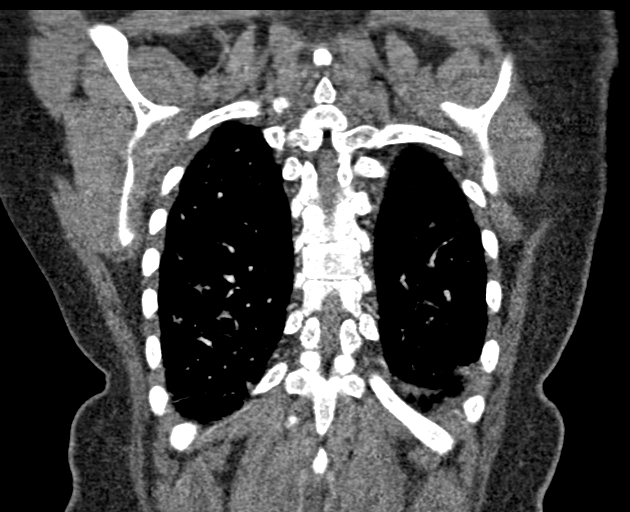

[18 of 46 positions shown; findings below may reference images not displayed]

FINDINGS: Cardiovascular: The study is moderate to high quality for the
evaluation of pulmonary embolism, with slightly suboptimal bolus
opacification. There are no filling defects in the central, lobar,
segmental or subsegmental pulmonary artery branches to suggest acute
pulmonary embolism. Normal course and caliber of the thoracic aorta.
Dilated main pulmonary artery (4.1 cm diameter). Mild cardiomegaly.
No significant pericardial fluid/thickening. Left internal jugular
Port-A-Cath terminates at the cavoatrial junction. Right internal
jugular Port-A-Cath terminates at the cavoatrial junction.

Mediastinum/Nodes: No discrete thyroid nodules. Unremarkable
esophagus. No axillary adenopathy. Enlarged 1.7 cm right
paratracheal node (series 6/image 35). Mild bilateral hilar
adenopathy, largest 1.0 cm on the right (series 6/image 41) and
cm on the left (series 6/image 49).

Lungs/Pleura: No pneumothorax. No pleural effusion. No acute
consolidative airspace disease, lung masses or significant pulmonary
nodules. Mosaic attenuation throughout both lungs. Scattered
parenchymal bands at both lung bases compatible with mild scarring
or atelectasis.

Upper abdomen: Contrast reflux into IVC and hepatic veins.
Autosplenectomy.

Musculoskeletal: No aggressive appearing focal osseous lesions.
Minimal endplate concavity in the midthoracic vertebral bodies.

Review of the MIP images confirms the above findings.
IMPRESSION: 1. No evidence of pulmonary embolism.
2. Prominently dilated main pulmonary artery, compatible with
pulmonary arterial hypertension.
3. Cardiomegaly. Contrast reflux into the IVC and hepatic veins,
suggesting right heart failure.
4. Mosaic attenuation throughout both lungs, nonspecific, probably
due to mosaic perfusion from pulmonary vascular disease.
5. Mild mediastinal and bilateral hilar lymphadenopathy,
nonspecific, most likely reactive.

## 2020-04-01 IMAGING — DX CHEST - 2 VIEW
2 series · 2 of 2 positions shown · non-contrast
Comparison: Chest x-ray and chest CT 09/08/2018

CLINICAL DATA: Chest pain.  Sickle cell disease.

EXAM:
CHEST - 2 VIEW

[chest lat]
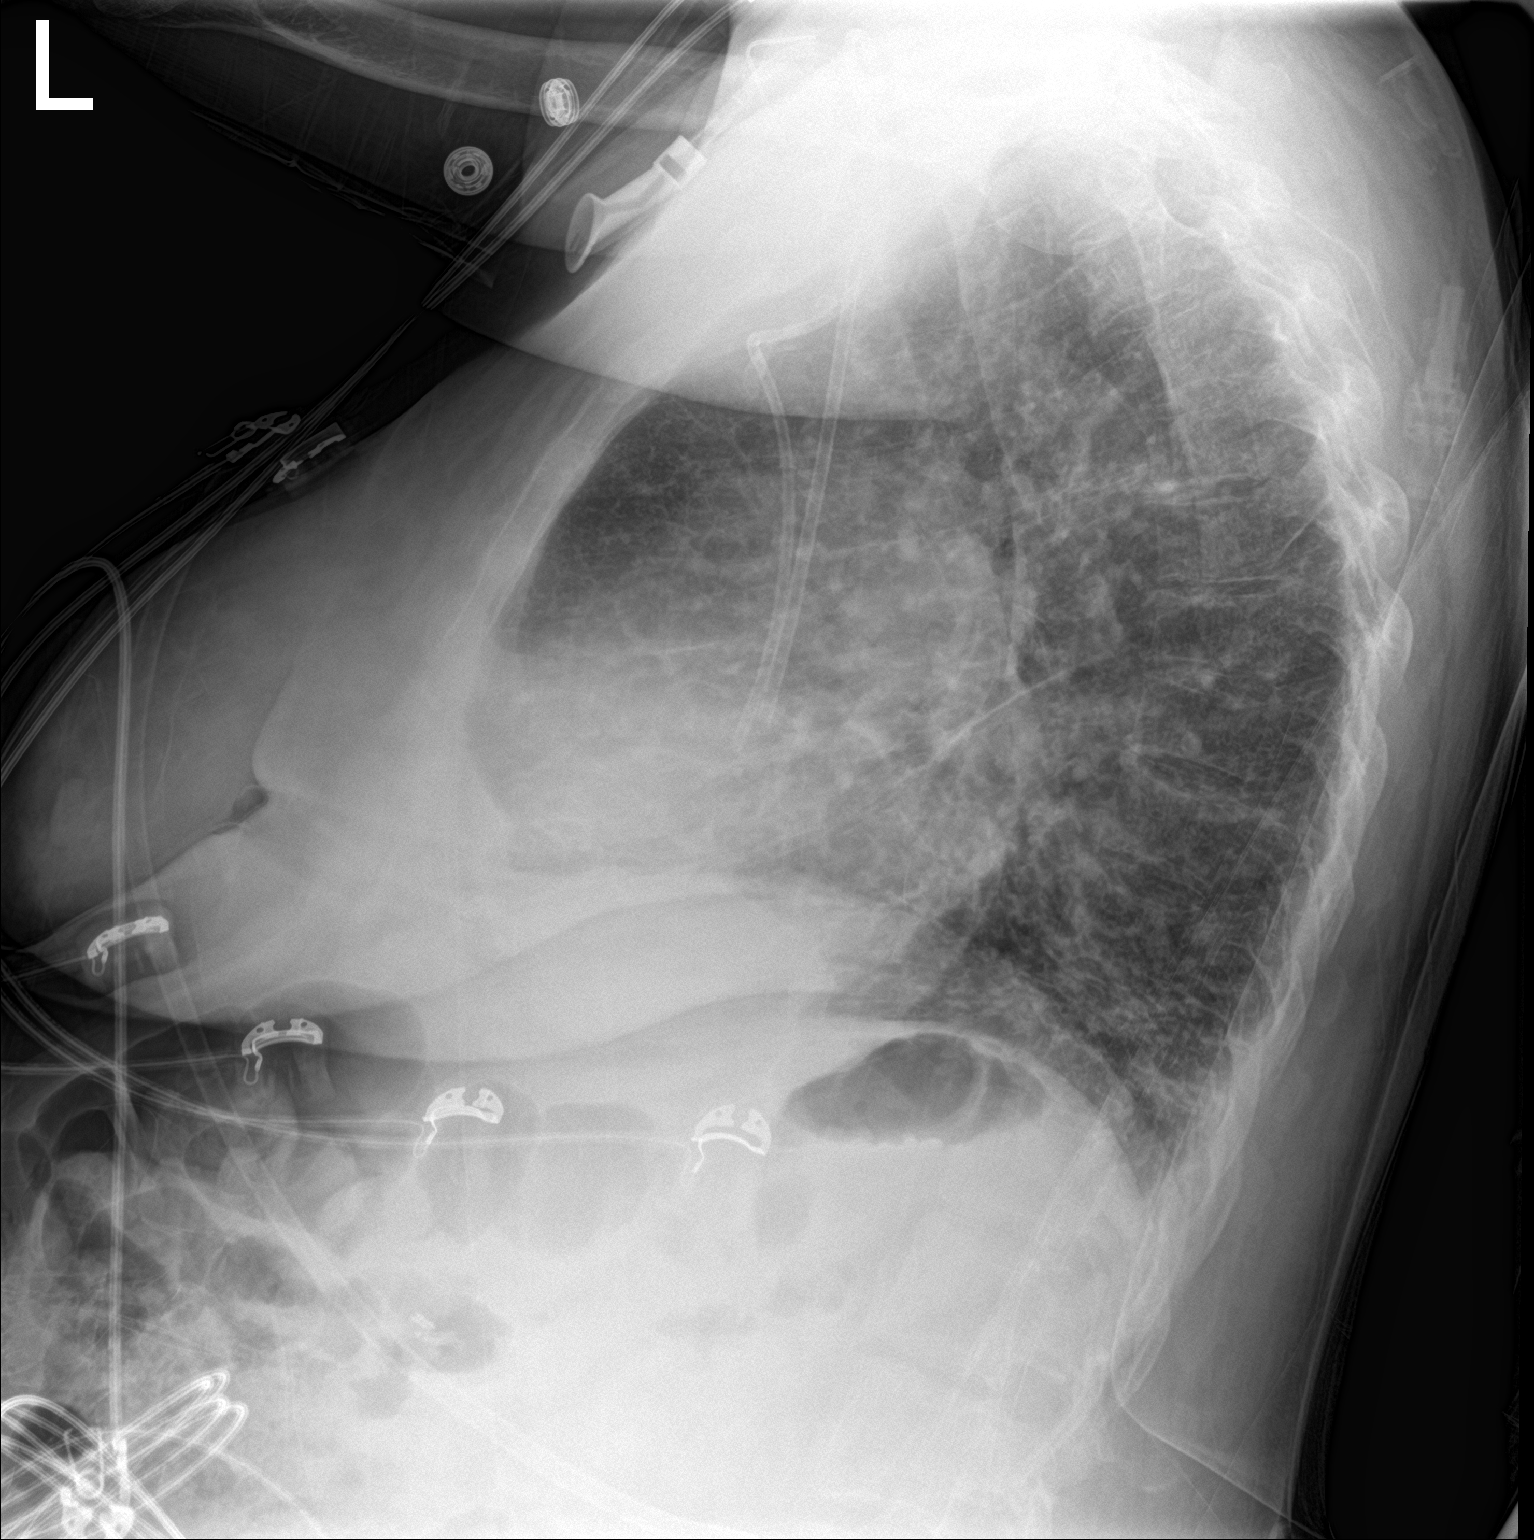

[chest ap]
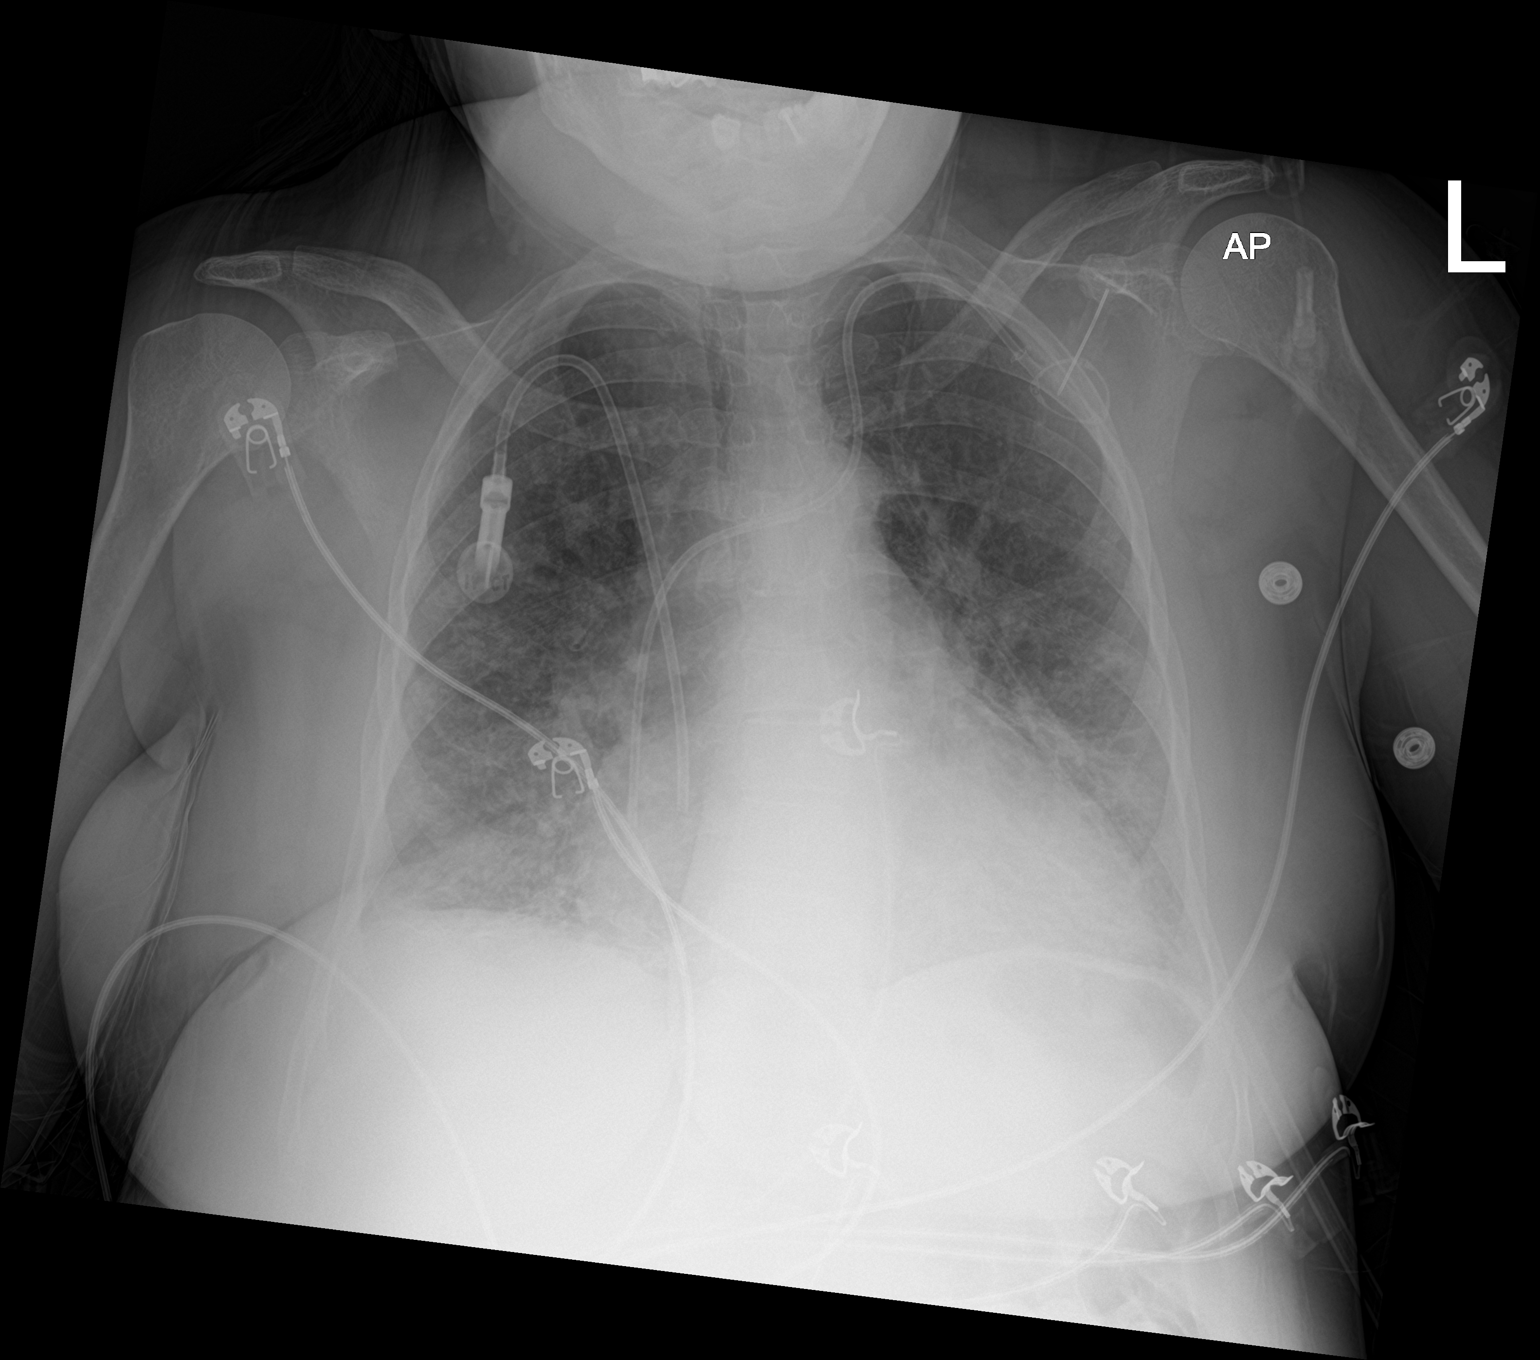

[2 of 2 positions shown; findings below may reference images not displayed]

FINDINGS: Stable cardiac enlargement, vascular congestion and probable
pulmonary edema. No pleural effusions or focal infiltrates.

Right and left Port-A-Cath are noted. No change. The bony thorax is
intact.
IMPRESSION: Cardiac enlargement, vascular congestion and mild interstitial
edema.

No definite infiltrates or effusions.

## 2021-10-12 DEATH — deceased
